# Patient Record
Sex: Male | Born: 1960 | Race: Asian | Hispanic: No | Marital: Married | Smoking: Former smoker
Health system: Southern US, Community
[De-identification: ages and names within clinical notes are randomized; demographics above are authoritative.]

---

## 2012-07-16 HISTORY — PX: COLONOSCOPY: SHX174

## 2015-03-03 ENCOUNTER — Other Ambulatory Visit: Payer: Self-pay | Admitting: Internal Medicine

## 2015-03-03 DIAGNOSIS — R634 Abnormal weight loss: Secondary | ICD-10-CM

## 2015-03-30 ENCOUNTER — Other Ambulatory Visit: Payer: Self-pay

## 2015-04-04 ENCOUNTER — Other Ambulatory Visit: Payer: Self-pay

## 2015-04-08 ENCOUNTER — Ambulatory Visit
Admission: RE | Admit: 2015-04-08 | Discharge: 2015-04-08 | Disposition: A | Discharge status: Home / Self Care | Payer: BC Managed Care – PPO | Source: Ambulatory Visit | Attending: Internal Medicine | Admitting: Internal Medicine

## 2015-04-08 DIAGNOSIS — R634 Abnormal weight loss: Secondary | ICD-10-CM

## 2015-04-08 MED ORDER — IOPAMIDOL (ISOVUE-300) INJECTION 61%
100.0000 mL | Freq: Once | INTRAVENOUS | Status: AC | PRN
  Administered 2015-04-08: 100 mL via INTRAVENOUS

## 2015-04-08 MED ORDER — IOHEXOL 300 MG/ML  SOLN
30.0000 mL | Freq: Once | INTRAMUSCULAR | Status: AC | PRN
  Administered 2015-04-08: 30 mL via ORAL

## 2015-05-12 ENCOUNTER — Inpatient Hospital Stay (HOSPITAL_COMMUNITY)
Admission: EM | Admit: 2015-05-12 | Discharge: 2015-05-19 | DRG: 339 | Disposition: A | Discharge status: Home / Self Care | Payer: BC Managed Care – PPO | Attending: Surgery | Admitting: Surgery

## 2015-05-12 ENCOUNTER — Encounter (HOSPITAL_COMMUNITY): Payer: Self-pay | Admitting: Emergency Medicine

## 2015-05-12 DIAGNOSIS — K353 Acute appendicitis with localized peritonitis: Principal | ICD-10-CM | POA: Diagnosis present

## 2015-05-12 DIAGNOSIS — K567 Ileus, unspecified: Secondary | ICD-10-CM | POA: Diagnosis not present

## 2015-05-12 DIAGNOSIS — R000 Tachycardia, unspecified: Secondary | ICD-10-CM | POA: Diagnosis not present

## 2015-05-12 DIAGNOSIS — N289 Disorder of kidney and ureter, unspecified: Secondary | ICD-10-CM | POA: Diagnosis present

## 2015-05-12 DIAGNOSIS — R1031 Right lower quadrant pain: Secondary | ICD-10-CM | POA: Diagnosis not present

## 2015-05-12 DIAGNOSIS — K3532 Acute appendicitis with perforation and localized peritonitis, without abscess: Secondary | ICD-10-CM

## 2015-05-12 DIAGNOSIS — E876 Hypokalemia: Secondary | ICD-10-CM | POA: Diagnosis not present

## 2015-05-12 LAB — COMPREHENSIVE METABOLIC PANEL
ALK PHOS: 64 U/L (ref 38–126)
ALT: 17 U/L (ref 17–63)
AST: 26 U/L (ref 15–41)
Albumin: 4.3 g/dL (ref 3.5–5.0)
Anion gap: 9 (ref 5–15)
BUN: 13 mg/dL (ref 6–20)
CHLORIDE: 98 mmol/L — AB (ref 101–111)
CO2: 27 mmol/L (ref 22–32)
CREATININE: 1.36 mg/dL — AB (ref 0.61–1.24)
Calcium: 9.4 mg/dL (ref 8.9–10.3)
GFR calc Af Amer: >60 mL/min (ref >60)
GFR, EST NON AFRICAN AMERICAN: 58 mL/min — AB (ref >60)
Glucose, Bld: 141 mg/dL — ABNORMAL HIGH (ref 65–99)
Potassium: 4.9 mmol/L (ref 3.5–5.1)
Sodium: 134 mmol/L — ABNORMAL LOW (ref 135–145)
Total Bilirubin: 2.7 mg/dL — ABNORMAL HIGH (ref 0.3–1.2)
Total Protein: 8 g/dL (ref 6.5–8.1)

## 2015-05-12 LAB — CBC
HCT: 49.3 % (ref 39.0–52.0)
Hemoglobin: 16.8 g/dL (ref 13.0–17.0)
MCH: 30.7 pg (ref 26.0–34.0)
MCHC: 34.1 g/dL (ref 30.0–36.0)
MCV: 90.1 fL (ref 78.0–100.0)
PLATELETS: 264 10*3/uL (ref 150–400)
RBC: 5.47 MIL/uL (ref 4.22–5.81)
RDW: 12.6 % (ref 11.5–15.5)
WBC: 15.3 10*3/uL — AB (ref 4.0–10.5)

## 2015-05-12 LAB — LIPASE, BLOOD: LIPASE: 24 U/L (ref 11–51)

## 2015-05-12 MED ORDER — SODIUM CHLORIDE 0.9 % IV SOLN: 1000.0000 mL | INTRAVENOUS | Status: DC

## 2015-05-12 MED ORDER — HYDROMORPHONE HCL 1 MG/ML IJ SOLN
1.0000 mg | Freq: Once | INTRAMUSCULAR | Status: AC
  Administered 2015-05-13: 1 mg via INTRAVENOUS
  Filled 2015-05-12: qty 1

## 2015-05-12 MED ORDER — ONDANSETRON HCL 4 MG/2ML IJ SOLN
4.0000 mg | Freq: Once | INTRAMUSCULAR | Status: AC
  Administered 2015-05-13: 4 mg via INTRAVENOUS
  Filled 2015-05-12: qty 2

## 2015-05-12 MED ORDER — SODIUM CHLORIDE 0.9 % IV SOLN
1000.0000 mL | Freq: Once | INTRAVENOUS | Status: AC
  Administered 2015-05-13: 1000 mL via INTRAVENOUS

## 2015-05-12 NOTE — ED Notes (Signed)
Pt in EMS from home reporting RLQ abd pain since yesterday. Denies N/V/D. Pt having normal BM. Pt speaks [place] and has a Statistician"korean surgeon" with him per EMS. This friend who is a Careers advisersurgeon told pt not to accept an IV with fluids or pain medicine and told the pt not to eat or drink anything, this "surgeon friend" has also self dx the pt with appendicitis and gastritis

## 2015-05-12 NOTE — ED Provider Notes (Signed)
CSN: [id number]     Arrival date & time 05/12/15  2219 History   By signing my name below, I, Alexander Watts, attest that this documentation has been prepared under the direction and in the presence of [name], MD.  Electronically Signed: Arlan Watts, ED Scribe. 05/12/2015. 11:49 PM.  Chief Complaint  Patient presents with  . Abdominal Pain   The history is provided by the patient. No language interpreter was used.    HPI Comments: Alexander Watts brought in by EMS is a 54 y.o. male without any pertinent past medical history who presents to the Emergency Department complaining of RLQ abdominal pain x 2 day. Pain made worse this evening after drinking some water. No other aggravating or alleviating factors reported since time of onset. Ongoing mild chills also reported. No OTC medications or home remedies attempted prior to arrival. Denies any recent fever, chills, nausea, vomiting, constipation, diarrhea, or dysuria. Denies any previous history of same.  PCP: No primary care provider on file.    History reviewed. No pertinent past medical history. History reviewed. No pertinent past surgical history. No family history on file. Social History  Substance Use Topics  . Smoking status: None  . Smokeless tobacco: None  . Alcohol Use: None    Review of Systems  Constitutional: Negative for fever and chills.  Respiratory: Negative for cough and shortness of breath.   Cardiovascular: Negative for chest pain.  Gastrointestinal: Positive for abdominal pain. Negative for nausea, vomiting, diarrhea and constipation.  Skin: Negative for rash.  Neurological: Negative for headaches.  Psychiatric/Behavioral: Negative for confusion.  All other systems reviewed and are negative.     Allergies  Review of patient's allergies indicates no known allergies.  Home Medications   Prior to Admission medications   Not on File   Triage Vitals: BP 125/75 mmHg  Pulse 111  Temp(Src) 99.5 F (37.5  C) (Oral)  Resp 18  Ht  (1.803 m)  Wt 180 lb (81.647 kg)  BMI 25.12 kg/m2  SpO2 97%  Physical Exam  Constitutional: He is oriented to person, place, and time. He appears well-developed and well-nourished.  HENT:  Head: Normocephalic and atraumatic.  Eyes: EOM are normal. Pupils are equal, round, and reactive to light.  Neck: Normal range of motion. Neck supple. No JVD present.  Cardiovascular: Normal rate, regular rhythm, normal heart sounds and intact distal pulses.   No murmur heard. Pulmonary/Chest: Effort normal and breath sounds normal. He has no wheezes. He has no rales. He exhibits no tenderness.  Abdominal: Soft. He exhibits no distension and no mass. There is tenderness. There is rebound. There is no guarding.  Diffuse tenderness noted. Max tenderness in RLQ with mild rebound tenderness  Musculoskeletal: Normal range of motion. He exhibits no edema.  Lymphadenopathy:    He has no cervical adenopathy.  Neurological: He is alert and oriented to person, place, and time. No cranial nerve deficit. He exhibits normal muscle tone. Coordination normal.  Skin: Skin is warm and dry. No rash noted.  Psychiatric: He has a normal mood and affect. His behavior is normal. Judgment and thought content normal.  Nursing note and vitals reviewed.   ED Course  Procedures (including critical care time)  DIAGNOSTIC STUDIES: Oxygen Saturation is 97% on RA, adequate by my interpretation.    COORDINATION OF CARE: 11:24 PM- Will order Lipase, CMP, CBC, and urinalysis.Discussed treatment plan with pt at bedside and pt agreed to plan.     Labs Review Results  for orders placed or performed during the hospital encounter of 05/12/15  Lipase, blood  Result Value Ref Range   Lipase 24 11 - 51 U/L  Comprehensive metabolic panel  Result Value Ref Range   Sodium 134 (L) 135 - 145 mmol/L   Potassium 4.9 3.5 - 5.1 mmol/L   Chloride 98 (L) 101 - 111 mmol/L   CO2 27 22 - 32 mmol/L   Glucose,  Bld 141 (H) 65 - 99 mg/dL   BUN 13 6 - 20 mg/dL   Creatinine, Ser 1.611.36 (H) 0.61 - 1.24 mg/dL   Calcium 9.4 8.9 - 09.610.3 mg/dL   Total Protein 8.0 6.5 - 8.1 g/dL   Albumin 4.3 3.5 - 5.0 g/dL   AST 26 15 - 41 U/L   ALT 17 17 - 63 U/L   Alkaline Phosphatase 64 38 - 126 U/L   Total Bilirubin 2.7 (H) 0.3 - 1.2 mg/dL   GFR calc non Af Amer 58 (L) >60 mL/min   GFR calc Af Amer >60 >60 mL/min   Anion gap 9 5 - 15  CBC  Result Value Ref Range   WBC 15.3 (H) 4.0 - 10.5 K/uL   RBC 5.47 4.22 - 5.81 MIL/uL   Hemoglobin 16.8 13.0 - 17.0 g/dL   HCT 04.549.3 40.939.0 - 81.152.0 %   MCV 90.1 78.0 - 100.0 fL   MCH 30.7 26.0 - 34.0 pg   MCHC 34.1 30.0 - 36.0 g/dL   RDW 91.412.6 78.211.5 - 95.615.5 %   Platelets 264 150 - 400 K/uL   Imaging Review Ct Abdomen Pelvis W Contrast  05/13/2015  CLINICAL DATA:  Severe RIGHT lower quadrant pain for 2 days, worse with drinking water. EXAM: CT ABDOMEN AND PELVIS WITH CONTRAST TECHNIQUE: Multidetector CT imaging of the abdomen and pelvis was performed using the standard protocol following bolus administration of intravenous contrast. CONTRAST:  100mL OMNIPAQUE IOHEXOL 300 MG/ML  SOLN COMPARISON:  CT abdomen and pelvis April 08, 2015 FINDINGS: LUNG BASES: Included view of the lung bases demonstrate patchy peripheral airspace opacities. Dependent atelectasis LEFT lung. SOLID ORGANS: The liver, spleen, gallbladder, pancreas and adrenal glands are unremarkable. GASTROINTESTINAL TRACT: The stomach, small and large bowel are normal in course and caliber without inflammatory changes. The appendix is enlarged, 13 mm transaxial dimension with multiple appendicoliths, largest measuring 6 mm. Appendix is hyperemic with extensive periappendiceal inflammation and small to moderate amount of free fluid in the RIGHT lower abdomen tracking to the mid pelvis Enteric contrast has not yet reached the distal small bowel. KIDNEYS/ URINARY TRACT: Kidneys are orthotopic, demonstrating symmetric enhancement. No  nephrolithiasis, hydronephrosis or solid renal masses. The unopacified ureters are normal in course and caliber. Delayed imaging through the kidneys demonstrates symmetric prompt contrast excretion within the proximal urinary collecting system. Urinary bladder is well distended and unremarkable. PERITONEUM/RETROPERITONEUM: Aortoiliac vessels are normal in course and caliber, trace calcific atherosclerosis. No lymphadenopathy by CT size criteria. Prostate size is normal. No intraperitoneal free fluid nor free air. SOFT TISSUE/OSSEOUS STRUCTURES: Non-suspicious. IMPRESSION: Acute appendicitis is likely perforated.  No abscess. Acute findings discussed with and reconfirmed by Dr.Tylan Briguglio on 05/13/2015 at 2:46 am. RIGHT lower lobe patchy atelectasis, less likely early pneumonia. Electronically Signed   By: Awilda Metroourtnay  Bloomer M.D.   On: 05/13/2015 02:47   I have personally reviewed and evaluated these images and lab results as part of my medical decision-making.   MDM   Final diagnoses:  Acute perforated appendicitis Renal insufficiency Elevated bilirubin  Abdominal pain suspicious for appendicitis. He shows signs of generalized peritoneal irritation which is concerning for possible perforation. He is sent for CT of abdomen and pelvis which confirms appendicitis with probable perforation. He is started on Zosyn for antibiotic coverage. Case is discussed with Dr. Andrey Campanile of surgery service who agrees to admit the patient. Also, please note mild renal insufficiency and elevated bilirubin. 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  I personally performed the services described in this documentation, which was scribed in my presence. The recorded information has been reviewed and is accurate.      [name], MD 05/13/15 [phone]

## 2015-05-12 NOTE — ED Notes (Signed)
EDP at bedside, translator is being used to speak with pt.

## 2015-05-13 ENCOUNTER — Encounter (HOSPITAL_COMMUNITY): Payer: Self-pay

## 2015-05-13 ENCOUNTER — Emergency Department (HOSPITAL_COMMUNITY): Payer: BC Managed Care – PPO | Admitting: Anesthesiology

## 2015-05-13 ENCOUNTER — Emergency Department (HOSPITAL_COMMUNITY): Payer: BC Managed Care – PPO

## 2015-05-13 ENCOUNTER — Encounter (HOSPITAL_COMMUNITY): Admission: EM | Disposition: A | Discharge status: Home / Self Care | Payer: Self-pay | Source: Home / Self Care

## 2015-05-13 DIAGNOSIS — E876 Hypokalemia: Secondary | ICD-10-CM | POA: Diagnosis not present

## 2015-05-13 DIAGNOSIS — R000 Tachycardia, unspecified: Secondary | ICD-10-CM | POA: Diagnosis not present

## 2015-05-13 DIAGNOSIS — K567 Ileus, unspecified: Secondary | ICD-10-CM | POA: Diagnosis not present

## 2015-05-13 DIAGNOSIS — R17 Unspecified jaundice: Secondary | ICD-10-CM | POA: Diagnosis present

## 2015-05-13 DIAGNOSIS — K3532 Acute appendicitis with perforation and localized peritonitis, without abscess: Secondary | ICD-10-CM | POA: Diagnosis present

## 2015-05-13 DIAGNOSIS — N289 Disorder of kidney and ureter, unspecified: Secondary | ICD-10-CM | POA: Diagnosis present

## 2015-05-13 DIAGNOSIS — K353 Acute appendicitis with localized peritonitis: Secondary | ICD-10-CM | POA: Diagnosis present

## 2015-05-13 DIAGNOSIS — R1031 Right lower quadrant pain: Secondary | ICD-10-CM | POA: Diagnosis present

## 2015-05-13 HISTORY — PX: APPENDECTOMY: SHX54

## 2015-05-13 HISTORY — PX: LAPAROSCOPIC APPENDECTOMY: SHX408

## 2015-05-13 SURGERY — APPENDECTOMY, LAPAROSCOPIC
Anesthesia: General | Site: Abdomen

## 2015-05-13 MED ORDER — 0.9 % SODIUM CHLORIDE (POUR BTL) OPTIME
TOPICAL | Status: DC | PRN
  Administered 2015-05-13: 1000 mL

## 2015-05-13 MED ORDER — LIDOCAINE HCL (CARDIAC) 20 MG/ML IV SOLN
INTRAVENOUS | Status: AC
  Filled 2015-05-13: qty 5

## 2015-05-13 MED ORDER — ROCURONIUM BROMIDE 50 MG/5ML IV SOLN
INTRAVENOUS | Status: AC
  Filled 2015-05-13: qty 2

## 2015-05-13 MED ORDER — PIPERACILLIN-TAZOBACTAM 3.375 G IVPB 30 MIN
3.3750 g | Freq: Once | INTRAVENOUS | Status: AC
  Administered 2015-05-13: 3.375 g via INTRAVENOUS
  Filled 2015-05-13: qty 50

## 2015-05-13 MED ORDER — ZOLPIDEM TARTRATE 5 MG PO TABS: 5.0000 mg | ORAL_TABLET | Freq: Every evening | ORAL | Status: DC | PRN

## 2015-05-13 MED ORDER — BUPIVACAINE-EPINEPHRINE (PF) 0.5% -1:200000 IJ SOLN
INTRAMUSCULAR | Status: AC
  Filled 2015-05-13: qty 30

## 2015-05-13 MED ORDER — SIMETHICONE 80 MG PO CHEW
40.0000 mg | CHEWABLE_TABLET | Freq: Four times a day (QID) | ORAL | Status: DC | PRN
  Administered 2015-05-14: 40 mg via ORAL
  Filled 2015-05-13: qty 1

## 2015-05-13 MED ORDER — ONDANSETRON HCL 4 MG/2ML IJ SOLN
INTRAMUSCULAR | Status: AC
  Filled 2015-05-13: qty 4

## 2015-05-13 MED ORDER — LIDOCAINE HCL (CARDIAC) 20 MG/ML IV SOLN
INTRAVENOUS | Status: DC | PRN
  Administered 2015-05-13: 50 mg via INTRAVENOUS

## 2015-05-13 MED ORDER — BUPIVACAINE-EPINEPHRINE (PF) 0.25% -1:200000 IJ SOLN
INTRAMUSCULAR | Status: AC
  Filled 2015-05-13: qty 30

## 2015-05-13 MED ORDER — EPHEDRINE SULFATE 50 MG/ML IJ SOLN
INTRAMUSCULAR | Status: AC
  Filled 2015-05-13: qty 1

## 2015-05-13 MED ORDER — FENTANYL CITRATE (PF) 100 MCG/2ML IJ SOLN
INTRAMUSCULAR | Status: DC | PRN
Start: 2015-05-13 — End: 2015-05-13
  Administered 2015-05-13: 150 ug via INTRAVENOUS

## 2015-05-13 MED ORDER — ONDANSETRON HCL 4 MG/2ML IJ SOLN
4.0000 mg | Freq: Four times a day (QID) | INTRAMUSCULAR | Status: DC | PRN
  Administered 2015-05-13: 4 mg via INTRAVENOUS
  Filled 2015-05-13: qty 2

## 2015-05-13 MED ORDER — ENOXAPARIN SODIUM 40 MG/0.4ML ~~LOC~~ SOLN
40.0000 mg | SUBCUTANEOUS | Status: DC
  Administered 2015-05-14 – 2015-05-19 (×6): 40 mg via SUBCUTANEOUS
  Filled 2015-05-13 (×6): qty 0.4

## 2015-05-13 MED ORDER — DIPHENHYDRAMINE HCL 25 MG PO CAPS: 25.0000 mg | ORAL_CAPSULE | Freq: Four times a day (QID) | ORAL | Status: DC | PRN

## 2015-05-13 MED ORDER — ONDANSETRON 4 MG PO TBDP: 4.0000 mg | ORAL_TABLET | Freq: Four times a day (QID) | ORAL | Status: DC | PRN

## 2015-05-13 MED ORDER — FENTANYL CITRATE (PF) 100 MCG/2ML IJ SOLN
INTRAMUSCULAR | Status: AC
  Filled 2015-05-13: qty 2

## 2015-05-13 MED ORDER — HYDROMORPHONE HCL 1 MG/ML IJ SOLN
1.0000 mg | INTRAMUSCULAR | Status: DC | PRN
  Administered 2015-05-13: 1 mg via INTRAVENOUS
  Filled 2015-05-13: qty 1

## 2015-05-13 MED ORDER — LACTATED RINGERS IV SOLN
INTRAVENOUS | Status: DC | PRN
  Administered 2015-05-13 (×2): via INTRAVENOUS

## 2015-05-13 MED ORDER — ONDANSETRON HCL 4 MG/2ML IJ SOLN
INTRAMUSCULAR | Status: DC | PRN
  Administered 2015-05-13: 4 mg via INTRAVENOUS

## 2015-05-13 MED ORDER — SODIUM CHLORIDE 0.9 % IR SOLN
Status: DC | PRN
  Administered 2015-05-13: 1000 mL

## 2015-05-13 MED ORDER — PIPERACILLIN-TAZOBACTAM 3.375 G IVPB
3.3750 g | Freq: Three times a day (TID) | INTRAVENOUS | Status: DC
  Administered 2015-05-13 – 2015-05-19 (×18): 3.375 g via INTRAVENOUS
  Filled 2015-05-13 (×21): qty 50

## 2015-05-13 MED ORDER — OXYCODONE-ACETAMINOPHEN 5-325 MG PO TABS
1.0000 | ORAL_TABLET | ORAL | Status: DC | PRN
  Administered 2015-05-13 – 2015-05-14 (×3): 2 via ORAL
  Administered 2015-05-15: 1 via ORAL
  Filled 2015-05-13 (×3): qty 2
  Filled 2015-05-13: qty 1

## 2015-05-13 MED ORDER — DIPHENHYDRAMINE HCL 50 MG/ML IJ SOLN: 25.0000 mg | Freq: Four times a day (QID) | INTRAMUSCULAR | Status: DC | PRN

## 2015-05-13 MED ORDER — SODIUM CHLORIDE 0.9 % IV SOLN
1000.0000 mL | INTRAVENOUS | Status: DC
  Administered 2015-05-13: 1000 mL via INTRAVENOUS

## 2015-05-13 MED ORDER — FENTANYL CITRATE (PF) 100 MCG/2ML IJ SOLN
25.0000 ug | INTRAMUSCULAR | Status: DC | PRN
  Administered 2015-05-13: 50 ug via INTRAVENOUS

## 2015-05-13 MED ORDER — SUCCINYLCHOLINE CHLORIDE 20 MG/ML IJ SOLN
INTRAMUSCULAR | Status: AC
  Filled 2015-05-13: qty 2

## 2015-05-13 MED ORDER — ONDANSETRON HCL 4 MG/2ML IJ SOLN
4.0000 mg | Freq: Four times a day (QID) | INTRAMUSCULAR | Status: DC | PRN
  Administered 2015-05-16 – 2015-05-18 (×3): 4 mg via INTRAVENOUS
  Filled 2015-05-13 (×3): qty 2

## 2015-05-13 MED ORDER — MIDAZOLAM HCL 2 MG/2ML IJ SOLN
INTRAMUSCULAR | Status: AC
  Filled 2015-05-13: qty 4

## 2015-05-13 MED ORDER — FENTANYL CITRATE (PF) 250 MCG/5ML IJ SOLN
INTRAMUSCULAR | Status: AC
  Filled 2015-05-13: qty 5

## 2015-05-13 MED ORDER — PHENYLEPHRINE HCL 10 MG/ML IJ SOLN
INTRAMUSCULAR | Status: DC | PRN
  Administered 2015-05-13: 80 ug via INTRAVENOUS

## 2015-05-13 MED ORDER — SODIUM CHLORIDE 0.9 % IJ SOLN
INTRAMUSCULAR | Status: AC
  Filled 2015-05-13: qty 10

## 2015-05-13 MED ORDER — GLYCOPYRROLATE 0.2 MG/ML IJ SOLN
INTRAMUSCULAR | Status: AC
  Filled 2015-05-13: qty 4

## 2015-05-13 MED ORDER — INFLUENZA VAC SPLIT QUAD 0.5 ML IM SUSY
0.5000 mL | PREFILLED_SYRINGE | INTRAMUSCULAR | Status: AC
  Administered 2015-05-15: 0.5 mL via INTRAMUSCULAR
  Filled 2015-05-13: qty 0.5

## 2015-05-13 MED ORDER — SUCCINYLCHOLINE CHLORIDE 20 MG/ML IJ SOLN
INTRAMUSCULAR | Status: DC | PRN
  Administered 2015-05-13: 100 mg via INTRAVENOUS

## 2015-05-13 MED ORDER — ACETAMINOPHEN 325 MG PO TABS: 650.0000 mg | ORAL_TABLET | Freq: Four times a day (QID) | ORAL | Status: DC | PRN

## 2015-05-13 MED ORDER — PROPOFOL 10 MG/ML IV BOLUS
INTRAVENOUS | Status: DC | PRN
  Administered 2015-05-13: 200 mg via INTRAVENOUS

## 2015-05-13 MED ORDER — BUPIVACAINE-EPINEPHRINE 0.25% -1:200000 IJ SOLN
INTRAMUSCULAR | Status: DC | PRN
  Administered 2015-05-13: 5 mL

## 2015-05-13 MED ORDER — MIDAZOLAM HCL 5 MG/5ML IJ SOLN
INTRAMUSCULAR | Status: DC | PRN
  Administered 2015-05-13: 2 mg via INTRAVENOUS

## 2015-05-13 MED ORDER — IOHEXOL 300 MG/ML  SOLN
100.0000 mL | Freq: Once | INTRAMUSCULAR | Status: AC | PRN
  Administered 2015-05-13: 100 mL via INTRAVENOUS

## 2015-05-13 MED ORDER — GLYCOPYRROLATE 0.2 MG/ML IJ SOLN
INTRAMUSCULAR | Status: DC | PRN
  Administered 2015-05-13: 0.6 mg via INTRAVENOUS

## 2015-05-13 MED ORDER — ROCURONIUM BROMIDE 100 MG/10ML IV SOLN
INTRAVENOUS | Status: DC | PRN
  Administered 2015-05-13: 35 mg via INTRAVENOUS

## 2015-05-13 MED ORDER — POTASSIUM CHLORIDE IN NACL 20-0.9 MEQ/L-% IV SOLN
INTRAVENOUS | Status: DC
  Administered 2015-05-13 – 2015-05-14 (×4): via INTRAVENOUS
  Administered 2015-05-15 – 2015-05-16 (×2): 1 mL via INTRAVENOUS
  Filled 2015-05-13 (×7): qty 1000

## 2015-05-13 MED ORDER — ACETAMINOPHEN 650 MG RE SUPP: 650.0000 mg | Freq: Four times a day (QID) | RECTAL | Status: DC | PRN

## 2015-05-13 MED ORDER — ONDANSETRON HCL 4 MG/2ML IJ SOLN: 4.0000 mg | Freq: Once | INTRAMUSCULAR | Status: DC | PRN

## 2015-05-13 MED ORDER — PROPOFOL 10 MG/ML IV BOLUS
INTRAVENOUS | Status: AC
  Filled 2015-05-13: qty 20

## 2015-05-13 MED ORDER — NEOSTIGMINE METHYLSULFATE 10 MG/10ML IV SOLN
INTRAVENOUS | Status: DC | PRN
  Administered 2015-05-13: 4 mg via INTRAVENOUS

## 2015-05-13 SURGICAL SUPPLY — 48 items
APPLIER CLIP ROT 10 11.4 M/L (STAPLE)
BENZOIN TINCTURE PRP APPL 2/3 (GAUZE/BANDAGES/DRESSINGS) ×3 IMPLANT
BLADE SURG ROTATE 9660 (MISCELLANEOUS) IMPLANT
CANISTER SUCTION 2500CC (MISCELLANEOUS) ×3 IMPLANT
CHLORAPREP W/TINT 26ML (MISCELLANEOUS) ×3 IMPLANT
CLIP APPLIE ROT 10 11.4 M/L (STAPLE) IMPLANT
CLOSURE WOUND 1/2 X4 (GAUZE/BANDAGES/DRESSINGS) ×1
COVER SURGICAL LIGHT HANDLE (MISCELLANEOUS) ×3 IMPLANT
CUTTER FLEX LINEAR 45M (STAPLE) ×3 IMPLANT
DRAIN CHANNEL 19F RND (DRAIN) ×3 IMPLANT
DRSG TEGADERM 2-3/8X2-3/4 SM (GAUZE/BANDAGES/DRESSINGS) ×6 IMPLANT
DRSG TEGADERM 4X4.75 (GAUZE/BANDAGES/DRESSINGS) ×3 IMPLANT
ELECT REM PT RETURN 9FT ADLT (ELECTROSURGICAL) ×3
ELECTRODE REM PT RTRN 9FT ADLT (ELECTROSURGICAL) ×1 IMPLANT
ENDOLOOP SUT PDS II  0 18 (SUTURE)
ENDOLOOP SUT PDS II 0 18 (SUTURE) IMPLANT
EVACUATOR SILICONE 100CC (DRAIN) ×3 IMPLANT
FILTER SMOKE EVAC LAPAROSHD (FILTER) ×3 IMPLANT
GAUZE SPONGE 2X2 8PLY STRL LF (GAUZE/BANDAGES/DRESSINGS) ×1 IMPLANT
GLOVE BIO SURGEON STRL SZ 6.5 (GLOVE) ×2 IMPLANT
GLOVE BIO SURGEON STRL SZ7 (GLOVE) ×3 IMPLANT
GLOVE BIO SURGEONS STRL SZ 6.5 (GLOVE) ×1
GLOVE BIOGEL PI IND STRL 6.5 (GLOVE) ×2 IMPLANT
GLOVE BIOGEL PI IND STRL 7.5 (GLOVE) ×2 IMPLANT
GLOVE BIOGEL PI INDICATOR 6.5 (GLOVE) ×4
GLOVE BIOGEL PI INDICATOR 7.5 (GLOVE) ×4
GLOVE ECLIPSE 7.5 STRL STRAW (GLOVE) ×3 IMPLANT
GOWN STRL REUS W/ TWL LRG LVL3 (GOWN DISPOSABLE) ×3 IMPLANT
GOWN STRL REUS W/TWL LRG LVL3 (GOWN DISPOSABLE) ×6
KIT BASIN OR (CUSTOM PROCEDURE TRAY) ×3 IMPLANT
KIT ROOM TURNOVER OR (KITS) ×3 IMPLANT
NS IRRIG 1000ML POUR BTL (IV SOLUTION) ×3 IMPLANT
PAD ARMBOARD 7.5X6 YLW CONV (MISCELLANEOUS) ×6 IMPLANT
POUCH SPECIMEN RETRIEVAL 10MM (ENDOMECHANICALS) ×3 IMPLANT
RELOAD STAPLE TA45 3.5 REG BLU (ENDOMECHANICALS) ×6 IMPLANT
SCALPEL HARMONIC ACE (MISCELLANEOUS) ×3 IMPLANT
SET IRRIG TUBING LAPAROSCOPIC (IRRIGATION / IRRIGATOR) ×3 IMPLANT
SPECIMEN JAR SMALL (MISCELLANEOUS) ×3 IMPLANT
SPONGE GAUZE 2X2 STER 10/PKG (GAUZE/BANDAGES/DRESSINGS) ×2
STRIP CLOSURE SKIN 1/2X4 (GAUZE/BANDAGES/DRESSINGS) ×2 IMPLANT
SUT ETHILON 2 0 FS 18 (SUTURE) ×3 IMPLANT
SUT MNCRL AB 4-0 PS2 18 (SUTURE) ×3 IMPLANT
TOWEL OR 17X24 6PK STRL BLUE (TOWEL DISPOSABLE) ×3 IMPLANT
TOWEL OR 17X26 10 PK STRL BLUE (TOWEL DISPOSABLE) ×3 IMPLANT
TRAY LAPAROSCOPIC MC (CUSTOM PROCEDURE TRAY) ×3 IMPLANT
TROCAR XCEL BLADELESS 5X75MML (TROCAR) ×6 IMPLANT
TROCAR XCEL BLUNT TIP 100MML (ENDOMECHANICALS) ×3 IMPLANT
TUBING INSUFFLATION (TUBING) ×3 IMPLANT

## 2015-05-13 NOTE — ED Notes (Signed)
Pt undressed, clothes in belongings bag at bedside

## 2015-05-13 NOTE — Anesthesia Preprocedure Evaluation (Addendum)
Anesthesia Evaluation  Patient identified by MRN, date of birth, ID band Patient awake    Reviewed: Allergy & Precautions, NPO status , Patient's Chart, lab work & pertinent test results  History of Anesthesia Complications Negative for: history of anesthetic complications  Airway Mallampati: II  TM Distance: >3 FB Neck ROM: Full    Dental no notable dental hx. (+) Dental Advisory Given   Pulmonary neg pulmonary ROS,    Pulmonary exam normal breath sounds clear to auscultation       Cardiovascular negative cardio ROS Normal cardiovascular exam Rhythm:Regular Rate:Normal     Neuro/Psych negative neurological ROS  negative psych ROS   GI/Hepatic negative GI ROS, Neg liver ROS,   Endo/Other  negative endocrine ROS  Renal/GU negative Renal ROS  negative genitourinary   Musculoskeletal negative musculoskeletal ROS (+)   Abdominal   Peds negative pediatric ROS (+)  Hematology negative hematology ROS (+)   Anesthesia Other Findings   Reproductive/Obstetrics negative OB ROS                             Anesthesia Physical Anesthesia Plan  ASA: II and emergent  Anesthesia Plan: General   Post-op Pain Management:    Induction: Intravenous, Rapid sequence and Cricoid pressure planned  Airway Management Planned: Oral ETT  Additional Equipment:   Intra-op Plan:   Post-operative Plan: Extubation in OR  Informed Consent: I have reviewed the patients History and Physical, chart, labs and discussed the procedure including the risks, benefits and alternatives for the proposed anesthesia with the patient or authorized representative who has indicated his/her understanding and acceptance.   Dental advisory given and Dental Advisory Given  Plan Discussed with: CRNA, Anesthesiologist and Surgeon  Anesthesia Plan Comments:       Anesthesia Quick Evaluation

## 2015-05-13 NOTE — Op Note (Signed)
Appendectomy, Lap, Procedure Note  Indications: The patient presented with a history of right-sided abdominal pain for about 3 days. A CT scan revealed findings consistent with acute appendicitis with possible perforation, but no sign of abscess.  He is brought emergently to the operating room for laparoscopic appendectomy.  Pre-operative Diagnosis: Acute appendicitis with generalized peritonitis  Post-operative Diagnosis: Perforated appendicitis with peritonitis  Surgeon: Manus RuddSUEI,Jeptha Hinnenkamp K.   Assistants: none  Anesthesia: General endotracheal anesthesia  ASA Class: 1E  Procedure Details  The patient was seen again in the Holding Room. The risks, benefits, complications, treatment options, and expected outcomes were discussed with the patient and/or family. The possibilities of reaction to medication, perforation of viscus, bleeding, recurrent infection, finding a normal appendix, the need for additional procedures, failure to diagnose a condition, and creating a complication requiring transfusion or operation were discussed. There was concurrence with the proposed plan and informed consent was obtained. The site of surgery was properly noted. The patient was taken to Operating Room, [facility] and the procedure verified as Appendectomy. A Time Out was held and the above information confirmed.  The patient was placed in the supine position and general anesthesia was induced.  A Foley catheter was placed under sterile technique. The abdomen was prepped and draped in a sterile fashion. A one centimeter supraumbilical incision was made.  Dissection was carried down to the fascia bluntly.  The fascia was incised vertically.  We entered the peritoneal cavity bluntly.  A pursestring suture was passed around the incision with a 0 Vicryl.  The Hasson cannula was introduced into the abdomen and the tails of the suture were used to hold the Hasson in place.   The pneumoperitoneum was then  established maintaining a maximum pressure of 15 mmHg.  Additional 5 mm cannulas then placed in the left lower quadrant of the abdomen and the right upper quadrant under direct visualization. A careful evaluation of the entire abdomen was carried out. The patient was placed in Trendelenburg and left lateral decubitus position.  The scope was moved to the right upper quadrant port site. The cecum was mobilized medially.  The appendix was identified and was quite edematous and thickened.  There is obvious perforation in at least two spots in the mid and distal appendix.  There is some fibrinous exudate and some cloudy fluid around the appendix.  The appendix was carefully dissected. The appendix was was skeletonized with the harmonic scalpel.   The appendix was divided at its base using an endo-GIA stapler. Minimal appendiceal stump was left in place. There was no evidence of bleeding, leakage, or complication after division of the appendix. Irrigation was also performed and irrigate suctioned from the abdomen as well.  A 19 Fr Blake drain was placed through the RUQ port site and placed into the pelvis.  The drain was secured with 3-0 Ethilon.  The umbilical port site was closed with the purse string suture. There was no residual palpable fascial defect.  The trocar site skin wounds were closed with 4-0 Monocryl.  Instrument, sponge, and needle counts were correct at the conclusion of the case.   Findings: The appendix was found to be inflamed. There were signs of necrosis.  There was perforation. There was not abscess formation.  Estimated Blood Loss:  less than 50 mL         Drains: 19 Fr Blake drain         Specimens: appendix  Complications:  None; patient tolerated the procedure well.         Disposition: PACU - hemodynamically stable.         Condition: stable  Wilmon Arms. Corliss Skains, MD, [facility]  General/ Trauma Surgery  05/13/2015 8:46 AM

## 2015-05-13 NOTE — Transfer of Care (Signed)
Immediate Anesthesia Transfer of Care Note  Patient: Alexander Watts  Procedure(s) Performed: Procedure(s): APPENDECTOMY LAPAROSCOPIC  Patient Location: PACU  Anesthesia Type:General  Level of Consciousness: awake, alert , patient cooperative and responds to stimulation  Airway & Oxygen Therapy: Patient Spontanous Breathing  Post-op Assessment: Report given to RN and Post -op Vital signs reviewed and stable  Post vital signs: Reviewed and stable  Last Vitals:  Filed Vitals:   05/13/15 0858  BP:   Pulse:   Temp: 37.2 C  Resp:     Complications: No apparent anesthesia complications

## 2015-05-13 NOTE — ED Notes (Signed)
O2 noted to be 88% RA. Placed pt on 2L O2 sats up to 96%.

## 2015-05-13 NOTE — ED Notes (Signed)
Pt back from CT

## 2015-05-13 NOTE — Anesthesia Postprocedure Evaluation (Signed)
  Anesthesia Post-op Note  Patient: Alexander Watts  Procedure(s) Performed: Procedure(s): APPENDECTOMY LAPAROSCOPIC  Patient Location: PACU  Anesthesia Type: General  Level of Consciousness: awake and alert   Airway and Oxygen Therapy: Patient Spontanous Breathing  Post-op Pain: mild  Post-op Assessment: Post-op Vital signs reviewed, Patient's Cardiovascular Status Stable, Respiratory Function Stable, Patent Airway and No signs of Nausea or vomiting  Last Vitals:  Filed Vitals:   05/13/15 0955  BP:   Pulse: 105  Temp:   Resp: 12    Post-op Vital Signs: stable   Complications: No apparent anesthesia complications

## 2015-05-13 NOTE — H&P (Signed)
Alexander Watts is an 54 y.o. male.   Chief Complaint: abd pain History obtained using language line interpreter.   HPI: 54 yo [place] male brought to ED by EMS after having abd pain for 2 days which is worsening. abd pain was initially generalized but now in RLQ. Constant. Worsening. Some mild chills. No prior symptoms. Decreased appetite today. No fever. No d/c. Denies other pmhx. Denies Rx meds. Denies tob. Denies cp, sob, doe, wt loss  History reviewed. No pertinent past medical history.  History reviewed. No pertinent past surgical history.  No family history on file. Social History:  has no tobacco, alcohol, and drug history on file.  Allergies: No Known Allergies   (Not in a hospital admission)  Results for orders placed or performed during the hospital encounter of 05/12/15 (from the past 48 hour(s))  Lipase, blood     Status: None   Collection Time: 05/12/15 10:35 PM  Result Value Ref Range   Lipase 24 11 - 51 U/L    Comment: Please note change in reference range.  Comprehensive metabolic panel     Status: Abnormal   Collection Time: 05/12/15 10:35 PM  Result Value Ref Range   Sodium 134 (L) 135 - 145 mmol/L   Potassium 4.9 3.5 - 5.1 mmol/L    Comment: SPECIMEN HEMOLYZED. HEMOLYSIS MAY AFFECT INTEGRITY OF RESULTS.   Chloride 98 (L) 101 - 111 mmol/L   CO2 27 22 - 32 mmol/L   Glucose, Bld 141 (H) 65 - 99 mg/dL   BUN 13 6 - 20 mg/dL   Creatinine, Ser 1.36 (H) 0.61 - 1.24 mg/dL   Calcium 9.4 8.9 - 10.3 mg/dL   Total Protein 8.0 6.5 - 8.1 g/dL   Albumin 4.3 3.5 - 5.0 g/dL   AST 26 15 - 41 U/L   ALT 17 17 - 63 U/L   Alkaline Phosphatase 64 38 - 126 U/L   Total Bilirubin 2.7 (H) 0.3 - 1.2 mg/dL   GFR calc non Af Amer 58 (L) >60 mL/min   GFR calc Af Amer >60 >60 mL/min    Comment: (NOTE) The eGFR has been calculated using the CKD EPI equation. This calculation has not been validated in all clinical situations. eGFR's persistently <60 mL/min signify possible Chronic  Kidney Disease.    Anion gap 9 5 - 15  CBC     Status: Abnormal   Collection Time: 05/12/15 10:35 PM  Result Value Ref Range   WBC 15.3 (H) 4.0 - 10.5 K/uL   RBC 5.47 4.22 - 5.81 MIL/uL   Hemoglobin 16.8 13.0 - 17.0 g/dL   HCT 49.3 39.0 - 52.0 %   MCV 90.1 78.0 - 100.0 fL   MCH 30.7 26.0 - 34.0 pg   MCHC 34.1 30.0 - 36.0 g/dL   RDW 12.6 11.5 - 15.5 %   Platelets 264 150 - 400 K/uL   Ct Abdomen Pelvis W Contrast  05/13/2015  CLINICAL DATA:  Severe RIGHT lower quadrant pain for 2 days, worse with drinking water. EXAM: CT ABDOMEN AND PELVIS WITH CONTRAST TECHNIQUE: Multidetector CT imaging of the abdomen and pelvis was performed using the standard protocol following bolus administration of intravenous contrast. CONTRAST:  144m OMNIPAQUE IOHEXOL 300 MG/ML  SOLN COMPARISON:  CT abdomen and pelvis April 08, 2015 FINDINGS: LUNG BASES: Included view of the lung bases demonstrate patchy peripheral airspace opacities. Dependent atelectasis LEFT lung. SOLID ORGANS: The liver, spleen, gallbladder, pancreas and adrenal glands are unremarkable. GASTROINTESTINAL TRACT: The stomach,  small and large bowel are normal in course and caliber without inflammatory changes. The appendix is enlarged, 13 mm transaxial dimension with multiple appendicoliths, largest measuring 6 mm. Appendix is hyperemic with extensive periappendiceal inflammation and small to moderate amount of free fluid in the RIGHT lower abdomen tracking to the mid pelvis Enteric contrast has not yet reached the distal small bowel. KIDNEYS/ URINARY TRACT: Kidneys are orthotopic, demonstrating symmetric enhancement. No nephrolithiasis, hydronephrosis or solid renal masses. The unopacified ureters are normal in course and caliber. Delayed imaging through the kidneys demonstrates symmetric prompt contrast excretion within the proximal urinary collecting system. Urinary bladder is well distended and unremarkable. PERITONEUM/RETROPERITONEUM: Aortoiliac  vessels are normal in course and caliber, trace calcific atherosclerosis. No lymphadenopathy by CT size criteria. Prostate size is normal. No intraperitoneal free fluid nor free air. SOFT TISSUE/OSSEOUS STRUCTURES: Non-suspicious. IMPRESSION: Acute appendicitis is likely perforated.  No abscess. Acute findings discussed with and reconfirmed by Dr.DAVID GLICK on 16/04/9603 at 2:46 am. RIGHT lower lobe patchy atelectasis, less likely early pneumonia. Electronically Signed   By: Elon Alas M.D.   On: 05/13/2015 02:47    Review of Systems  Constitutional: Positive for chills. Negative for fever and weight loss.  HENT: Negative for nosebleeds.   Eyes: Negative for blurred vision.  Respiratory: Negative for shortness of breath.   Cardiovascular: Negative for chest pain, palpitations, orthopnea and PND.       Denies DOE  Gastrointestinal: Positive for nausea and abdominal pain.  Genitourinary: Negative for dysuria and hematuria.  Musculoskeletal: Negative.   Skin: Negative for itching and rash.  Neurological: Negative for dizziness, focal weakness, seizures, loss of consciousness and headaches.       Denies TIAs, amaurosis fugax  Endo/Heme/Allergies: Does not bruise/bleed easily.  Psychiatric/Behavioral: The patient is not nervous/anxious.     Blood pressure 105/64, pulse 103, temperature 99.5 F (37.5 C), temperature source Oral, resp. rate 16, height 5' 11"  (1.803 m), weight 81.647 kg (180 lb), SpO2 94 %. Physical Exam  Vitals reviewed. Constitutional: He is oriented to person, place, and time. He appears well-developed and well-nourished.  Looks ill; clammy.   HENT:  Head: Normocephalic and atraumatic.  Right Ear: External ear normal.  Left Ear: External ear normal.  Eyes: Conjunctivae are normal. No scleral icterus.  Neck: Normal range of motion. Neck supple. No tracheal deviation present. No thyromegaly present.  Cardiovascular: Normal rate and normal heart sounds.    Respiratory: Effort normal and breath sounds normal. No stridor. No respiratory distress. He has no wheezes.  GI: Soft. There is tenderness. There is guarding (voluntary) and tenderness at McBurney's point. There is no rebound.    Significant RLQ TTP; +guarding. Probable localized peritonitis. No generalized peritonitis.   Musculoskeletal: He exhibits no edema or tenderness.  Lymphadenopathy:    He has no cervical adenopathy.  Neurological: He is alert and oriented to person, place, and time. He exhibits normal muscle tone.  Skin: Skin is warm and dry. No rash noted. He is not diaphoretic. No erythema. No pallor.  Psychiatric: He has a normal mood and affect. His behavior is normal. Judgment and thought content normal.     Assessment/Plan Acute appendicitis, probable ruptured Elevated Creatinine  We discussed the etiology and management of acute appendicitis. We discussed operative and nonoperative management.  I recommended operative management along with IV antibiotics.  We discussed laparoscopic appendectomy. We discussed the risk and benefits of surgery including but not limited to bleeding, infection, injury to surrounding structures, need to  convert to an open procedure, blood clot formation, post operative abscess or wound infection, staple line complications such as leak or bleeding, hernia formation, post operative ileus, need for additional procedures, anesthesia complications, and the typical postoperative course. I explained that the patient should expect a good improvement in their symptoms.  We discussed that he is higher risk for conversion to open, potential bowel resection, ileus.  Wife at bs for entire conversation.  Used interpreter for whole pt interview and counseling.  No addl questions.  Repeat labs in am  Leighton Ruff. Redmond Pulling, MD, FACS General, Bariatric, & Minimally Invasive Surgery [facility], [place]    [facility] M 05/13/2015, 5:23 AM

## 2015-05-13 NOTE — Anesthesia Procedure Notes (Signed)
Procedure Name: Intubation Date/Time: 05/13/2015 7:49 AM Performed by: Carmela RimaMARTINELLI, Maleeya Peterkin F Pre-anesthesia Checklist: Patient being monitored, Suction available, Emergency Drugs available, Patient identified and Timeout performed Patient Re-evaluated:Patient Re-evaluated prior to inductionOxygen Delivery Method: Circle system utilized Preoxygenation: Pre-oxygenation with 100% oxygen Intubation Type: IV induction Ventilation: Mask ventilation without difficulty Laryngoscope Size: Mac, 3 and Glidescope (jaw rigidity and anterior anatomy) Grade View: Grade II Tube type: Oral Tube size: 7.5 mm Number of attempts: 2 Placement Confirmation: positive ETCO2,  ETT inserted through vocal cords under direct vision and breath sounds checked- equal and bilateral Secured at: 22 cm Tube secured with: Tape Dental Injury: Teeth and Oropharynx as per pre-operative assessment

## 2015-05-13 NOTE — ED Notes (Signed)
Patient is resting comfortably. 

## 2015-05-13 NOTE — ED Notes (Signed)
Patient transported to CT 

## 2015-05-13 NOTE — Care Management Note (Signed)
Case Management Note  Patient Details  Name: Mellody MemosSeungwon Delprado MRN: [id number] Date of Birth: 01/29/1961  Subjective/Objective:                    Action/Plan:  Initial review completed  Expected Discharge Date:                  Expected Discharge Plan:  Home/Self Care  In-House Referral:     Discharge planning Services     Post Acute Care Choice:    Choice offered to:     DME Arranged:    DME Agency:     HH Arranged:    HH Agency:     Status of Service:  In process, will continue to follow  Medicare Important Message Given:    Date Medicare IM Given:    Medicare IM give by:    Date Additional Medicare IM Given:    Additional Medicare Important Message give by:     If discussed at Long Length of Stay Meetings, dates discussed:    Additional Comments:  Kingsley PlanWile, Conswella Bruney Marie, RN 05/13/2015, 3:22 PM

## 2015-05-14 LAB — BASIC METABOLIC PANEL
ANION GAP: 9 (ref 5–15)
BUN: 10 mg/dL (ref 6–20)
CALCIUM: 8.4 mg/dL — AB (ref 8.9–10.3)
CHLORIDE: 100 mmol/L — AB (ref 101–111)
CO2: 25 mmol/L (ref 22–32)
Creatinine, Ser: 1.34 mg/dL — ABNORMAL HIGH (ref 0.61–1.24)
GFR calc non Af Amer: 59 mL/min — ABNORMAL LOW (ref >60)
GLUCOSE: 111 mg/dL — AB (ref 65–99)
Potassium: 3.2 mmol/L — ABNORMAL LOW (ref 3.5–5.1)
SODIUM: 134 mmol/L — AB (ref 135–145)

## 2015-05-14 LAB — CBC
HCT: 43.4 % (ref 39.0–52.0)
HEMOGLOBIN: 14.2 g/dL (ref 13.0–17.0)
MCH: 30.2 pg (ref 26.0–34.0)
MCHC: 32.7 g/dL (ref 30.0–36.0)
MCV: 92.3 fL (ref 78.0–100.0)
Platelets: 217 10*3/uL (ref 150–400)
RBC: 4.7 MIL/uL (ref 4.22–5.81)
RDW: 13.2 % (ref 11.5–15.5)
WBC: 16.4 10*3/uL — ABNORMAL HIGH (ref 4.0–10.5)

## 2015-05-14 NOTE — Progress Notes (Signed)
1 Day Post-Op  Subjective: Stable and alert.  Afebrile.  Heart rate 100.  Respiratory rate 16.  SPO2 95%.  Excellent urine output.   Moving slowly with moderate pain.  He has been able to void.  He has been able to ambulate some.  Denies nausea.  Has been tolerating liquids. Morning lab work pending.  Objective: Vital signs in last 24 hours: Temp:  [97.9 F (36.6 C)-99.5 F (37.5 C)] 98.4 F (36.9 C) (10/29 0436) Pulse Rate:  [100-114] 101 (10/29 0436) Resp:  [12-19] 16 (10/29 0436) BP: (104-129)/(62-82) 129/82 mmHg (10/29 0436) SpO2:  [93 %-99 %] 95 % (10/29 0436)    Intake/Output from previous day: 10/28 0701 - 10/29 0700 In: 2803 [P.O.:480; I.V.:2223; IV Piggyback:100] Out: 2345 [Urine:2175; Drains:145; Blood:25] Intake/Output this shift: Total I/O In: 1583 [P.O.:360; I.V.:1123; IV Piggyback:100] Out: 1800 [Urine:1775; Drains:25]    EXAM: General appearance: Slightly sedated from pain medication, but appropriate.  Moves slowly.  Lots of pain.  Skin warm and dry. Resp: clear to auscultation bilaterally GI: Soft.  Appropriately tender.  Minimal bowel sounds.  Not distended.  JP drainage cloudy and watery.  Lab Results:  No results found for this or any previous visit (from the past 24 hour(s)).   Studies/Results: No results found.  . enoxaparin (LOVENOX) injection  40 mg Subcutaneous Q24H  . Influenza vac split quadrivalent PF  0.5 mL Intramuscular Tomorrow-1000  . piperacillin-tazobactam (ZOSYN)  IV  3.375 g Intravenous Q8H     Assessment/Plan: s/p Procedure(s): APPENDECTOMY LAPAROSCOPIC  POD #1.  Laparoscopic appendectomy for perforated appendicitis with localized peritonitis Stable, but not ready for discharge Advance diet and activities as tolerated Incentive spirometry encouraged. Continue IV antibiotics Drain to be left in for now.  @PROBHOSP @  LOS: 1 day    Jhoana Upham M 05/14/2015  . .prob

## 2015-05-15 LAB — BASIC METABOLIC PANEL
ANION GAP: 7 (ref 5–15)
BUN: 10 mg/dL (ref 6–20)
CALCIUM: 7.9 mg/dL — AB (ref 8.9–10.3)
CO2: 26 mmol/L (ref 22–32)
Chloride: 100 mmol/L — ABNORMAL LOW (ref 101–111)
Creatinine, Ser: 1.08 mg/dL (ref 0.61–1.24)
GLUCOSE: 108 mg/dL — AB (ref 65–99)
POTASSIUM: 3.4 mmol/L — AB (ref 3.5–5.1)
Sodium: 133 mmol/L — ABNORMAL LOW (ref 135–145)

## 2015-05-15 LAB — CBC
HEMATOCRIT: 38.1 % — AB (ref 39.0–52.0)
Hemoglobin: 12.3 g/dL — ABNORMAL LOW (ref 13.0–17.0)
MCH: 30 pg (ref 26.0–34.0)
MCHC: 32.3 g/dL (ref 30.0–36.0)
MCV: 92.9 fL (ref 78.0–100.0)
PLATELETS: 230 10*3/uL (ref 150–400)
RBC: 4.1 MIL/uL — AB (ref 4.22–5.81)
RDW: 13.1 % (ref 11.5–15.5)
WBC: 12.4 10*3/uL — AB (ref 4.0–10.5)

## 2015-05-15 MED ORDER — POTASSIUM CHLORIDE 10 MEQ/100ML IV SOLN
10.0000 meq | INTRAVENOUS | Status: DC
  Administered 2015-05-15: 10 meq via INTRAVENOUS
  Filled 2015-05-15: qty 100

## 2015-05-15 MED ORDER — POTASSIUM CHLORIDE CRYS ER 20 MEQ PO TBCR
40.0000 meq | EXTENDED_RELEASE_TABLET | Freq: Once | ORAL | Status: AC
  Administered 2015-05-15: 40 meq via ORAL
  Filled 2015-05-15: qty 2

## 2015-05-15 MED ORDER — BISACODYL 10 MG RE SUPP
10.0000 mg | Freq: Two times a day (BID) | RECTAL | Status: AC
  Administered 2015-05-15 (×2): 10 mg via RECTAL
  Filled 2015-05-15 (×2): qty 1

## 2015-05-15 MED ORDER — HYDROCODONE-ACETAMINOPHEN 10-325 MG PO TABS
1.0000 | ORAL_TABLET | ORAL | Status: DC | PRN
  Administered 2015-05-16 – 2015-05-18 (×4): 1 via ORAL
  Filled 2015-05-15 (×4): qty 1

## 2015-05-15 MED ORDER — HYDROMORPHONE HCL 1 MG/ML IJ SOLN: 0.5000 mg | INTRAMUSCULAR | Status: DC | PRN

## 2015-05-15 MED ORDER — POLYETHYLENE GLYCOL 3350 17 G PO PACK
17.0000 g | PACK | Freq: Once | ORAL | Status: AC
  Administered 2015-05-15: 17 g via ORAL
  Filled 2015-05-15: qty 1

## 2015-05-15 NOTE — Progress Notes (Signed)
2 Days Post-Op  Subjective: Drink lots of fluid just today.  Not much solid food.  Denies nausea No stool or flatus Feels distended No increase in pain Good urine output  Afebrile.  Vital signs stable. Potassium 3.4.  Creatinine 1.08.  WBC 12,400.  Coming down.  Hemoglobin 12.3.  Objective: Vital signs in last 24 hours: Temp:  [98.2 F (36.8 C)-98.7 F (37.1 C)] 98.4 F (36.9 C) (10/30 0509) Pulse Rate:  [101-107] 101 (10/30 0509) Resp:  [16-17] 16 (10/30 0509) BP: (123-132)/(76-84) 124/79 mmHg (10/30 0509) SpO2:  [93 %-97 %] 93 % (10/30 0509) Last BM Date:  (pre op)  Intake/Output from previous day: 10/29 0701 - 10/30 0700 In: 4300 [P.O.:1720; I.V.:2380; IV Piggyback:200] Out: 3592 [Urine:3400; Drains:192] Intake/Output this shift: Total I/O In: 360 [P.O.:360] Out: 350 [Urine:350]  General appearance: Alert.  Mild distress.  Asked like he doesn't feel that good but is not toxic or acutely ill. Resp: clear to auscultation bilaterally GI: Soft, but distended and tympanitic.  Hypoactive bowel sounds.  JP drainage is now clear and serous.  Lab Results:  Results for orders placed or performed during the hospital encounter of 05/12/15 (from the past 24 hour(s))  CBC     Status: Abnormal   Collection Time: 05/15/15  5:15 AM  Result Value Ref Range   WBC 12.4 (H) 4.0 - 10.5 K/uL   RBC 4.10 (L) 4.22 - 5.81 MIL/uL   Hemoglobin 12.3 (L) 13.0 - 17.0 g/dL   HCT 16.138.1 (L) 09.639.0 - 04.552.0 %   MCV 92.9 78.0 - 100.0 fL   MCH 30.0 26.0 - 34.0 pg   MCHC 32.3 30.0 - 36.0 g/dL   RDW 40.913.1 81.111.5 - 91.415.5 %   Platelets 230 150 - 400 K/uL  Basic metabolic panel     Status: Abnormal   Collection Time: 05/15/15  5:15 AM  Result Value Ref Range   Sodium 133 (L) 135 - 145 mmol/L   Potassium 3.4 (L) 3.5 - 5.1 mmol/L   Chloride 100 (L) 101 - 111 mmol/L   CO2 26 22 - 32 mmol/L   Glucose, Bld 108 (H) 65 - 99 mg/dL   BUN 10 6 - 20 mg/dL   Creatinine, Ser 7.821.08 0.61 - 1.24 mg/dL   Calcium 7.9 (L)  8.9 - 10.3 mg/dL   GFR calc non Af Amer >60 >60 mL/min   GFR calc Af Amer >60 >60 mL/min   Anion gap 7 5 - 15     Studies/Results: No results found.  . bisacodyl  10 mg Rectal BID  . enoxaparin (LOVENOX) injection  40 mg Subcutaneous Q24H  . Influenza vac split quadrivalent PF  0.5 mL Intramuscular Tomorrow-1000  . piperacillin-tazobactam (ZOSYN)  IV  3.375 g Intravenous Q8H  . polyethylene glycol  17 g Oral Once  . potassium chloride  10 mEq Intravenous Q1 Hr x 4     Assessment/Plan: s/p Procedure(s): APPENDECTOMY LAPAROSCOPIC  POD #2.  Laparoscopic appendectomy for perforated appendicitis with localized peritonitis Still has ileus and not ready for discharge yet Increase ambulation Dulcolax suppository MiraLAX Decreased narcotic Continue antibiotics Leave drain for now, but possibly remove upon discharge  Hypokalemia.  Mild.  May be contributing to ileus Replace aggressively Labs tomorrow  @PROBHOSP @  LOS: 2 days    Alexander Watts 05/15/2015  . .prob

## 2015-05-16 ENCOUNTER — Encounter (HOSPITAL_COMMUNITY): Payer: Self-pay | Admitting: Surgery

## 2015-05-16 LAB — BASIC METABOLIC PANEL
ANION GAP: 9 (ref 5–15)
BUN: 5 mg/dL — AB (ref 6–20)
CHLORIDE: 104 mmol/L (ref 101–111)
CO2: 25 mmol/L (ref 22–32)
Calcium: 8.6 mg/dL — ABNORMAL LOW (ref 8.9–10.3)
Creatinine, Ser: 1.01 mg/dL (ref 0.61–1.24)
Glucose, Bld: 122 mg/dL — ABNORMAL HIGH (ref 65–99)
POTASSIUM: 3.6 mmol/L (ref 3.5–5.1)
SODIUM: 138 mmol/L (ref 135–145)

## 2015-05-16 LAB — CBC
HEMATOCRIT: 38.8 % — AB (ref 39.0–52.0)
Hemoglobin: 12.8 g/dL — ABNORMAL LOW (ref 13.0–17.0)
MCH: 30.2 pg (ref 26.0–34.0)
MCHC: 33 g/dL (ref 30.0–36.0)
MCV: 91.5 fL (ref 78.0–100.0)
Platelets: 263 10*3/uL (ref 150–400)
RBC: 4.24 MIL/uL (ref 4.22–5.81)
RDW: 12.9 % (ref 11.5–15.5)
WBC: 13.2 10*3/uL — AB (ref 4.0–10.5)

## 2015-05-16 MED ORDER — AMOXICILLIN-POT CLAVULANATE 875-125 MG PO TABS: 1.0000 | ORAL_TABLET | Freq: Two times a day (BID) | ORAL | Status: AC

## 2015-05-16 MED ORDER — HYDROCODONE-ACETAMINOPHEN 10-325 MG PO TABS: 1.0000 | ORAL_TABLET | Freq: Four times a day (QID) | ORAL | Status: AC | PRN

## 2015-05-16 NOTE — Care Management Note (Signed)
Case Management Note  Patient Details  Name: Mellody MemosSeungwon Weakland MRN: [id number] Date of Birth: 01/19/1961  Subjective/Objective:                    Action/Plan:  UR updated  Expected Discharge Date:                  Expected Discharge Plan:  Home/Self Care  In-House Referral:     Discharge planning Services     Post Acute Care Choice:    Choice offered to:     DME Arranged:    DME Agency:     HH Arranged:    HH Agency:     Status of Service:  In process, will continue to follow  Medicare Important Message Given:    Date Medicare IM Given:    Medicare IM give by:    Date Additional Medicare IM Given:    Additional Medicare Important Message give by:     If discussed at Long Length of Stay Meetings, dates discussed:    Additional Comments:  Kingsley PlanWile, Camreigh Michie Marie, RN 05/16/2015, 1:51 PM

## 2015-05-16 NOTE — Discharge Summary (Signed)
  [facility] Discharge Summary   Patient ID: Alexander MemosSeungwon Watts MRN: [id number] DOB/AGE: 53/03/1961 53 y.o.  Admit date: 05/12/2015 Discharge date: 05/19/2015  Admitting Diagnosis: Acute perforated appendicitis with peritonitis  Discharge Diagnosis Patient Active Problem List   Diagnosis Date Noted  . Acute perforated appendicitis 05/13/2015    Consultants None  Imaging: No results found.  Procedures Dr. Corliss Skainssuei (05/13/15) - Laparoscopic Appendectomy  Hospital Course:  54 yo [place] male brought to ED by EMS after having abd pain for 2 days which is worsening. abd pain was initially generalized but now in RLQ. Constant. Worsening. Some mild chills. No prior symptoms. Decreased appetite today. No fever. No d/c. Denies other pmhx. Denies Rx meds. Denies tob. Denies cp, sob, doe, wt loss.  Workup showed acute perforated appendicitis with generalized peritonitis.  Patient was admitted and underwent procedure listed above.  Tolerated procedure well and was transferred to the floor.  Diet was advanced as tolerated.  Mild leukocytosis persists, but clinically much improved.  He had some tachycardia and thus he was kept a couple of more days.  His WBC peaked at 15.0, and on 05/18/15 was 14.5.  He is much less distended, having BM's, pain much improved, afebrile.  WBC on day of discharge is only 11.0.  On POD #6, the patient was voiding well, tolerating diet, ambulating well, pain well controlled, vital signs stable, incisions c/d/i and felt stable for discharge home.  His JP drain was removed prior to discharge as it was only serous drainage.  Patient will follow up in our office in 2 weeks and knows to call with questions or concerns.  He will be discharged with augmentin to finish out his antibiotic course.      Medication List    TAKE these medications        amoxicillin-clavulanate 875-125 MG tablet  Commonly known as:  AUGMENTIN  Take 1 tablet by mouth 2 (two) times daily.     HYDROcodone-acetaminophen 10-325 MG tablet  Commonly known as:  NORCO  Take 1 tablet by mouth every 6 (six) hours as needed for moderate pain.     ondansetron 4 MG disintegrating tablet  Commonly known as:  ZOFRAN-ODT  Take 1 tablet (4 mg total) by mouth every 8 (eight) hours as needed for nausea or vomiting.         Follow-up Information    [facility] On [facility].   Specialty:  General Surgery   Why:  For post-operation check. Your appointment is at 11:45am, please arrive at least 30min before your appointment to complete your check in paperwork.  If you are unable to arrive 30min prior to your appointment time we may have to cancel or reschedule you   Contact information:   [address] [place] [id number] [phone]      Signed: Bradd CanaryMegan N. Paralee Pendergrass, PA-C [facility] [phone]  05/19/2015, 8:05 AM

## 2015-05-16 NOTE — Discharge Instructions (Signed)
Your appointment is at 11:45am, please arrive at least 30min before your appointment to complete your check in paperwork.  If you are unable to arrive 30min prior to your appointment time we may have to cancel or reschedule you. ° °LAPAROSCOPIC SURGERY: POST OP INSTRUCTIONS  °1. DIET: Follow a light bland diet the first 24 hours after arrival home, such as soup, liquids, crackers, etc. Be sure to include lots of fluids daily. Avoid fast food or heavy meals as your are more likely to get nauseated. Eat a low fat the next few days after surgery.  °2. Take your usually prescribed home medications unless otherwise directed. °3. PAIN CONTROL:  °1. Pain is best controlled by a usual combination of three different methods TOGETHER:  °1. Ice/Heat °2. Over the counter pain medication °3. Prescription pain medication °2. Most patients will experience some swelling and bruising around the incisions. Ice packs or heating pads (30-60 minutes up to 6 times a day) will help. Use ice for the first few days to help decrease swelling and bruising, then switch to heat to help relax tight/sore spots and speed recovery. Some people prefer to use ice alone, heat alone, alternating between ice & heat. Experiment to what works for you. Swelling and bruising can take several weeks to resolve.  °3. It is helpful to take an over-the-counter pain medication regularly for the first few weeks. Choose one of the following that works best for you:  °1. Naproxen (Aleve, etc) Two 220mg tabs twice a day °2. Ibuprofen (Advil, etc) Three 200mg tabs four times a day (every meal & bedtime) °3. Acetaminophen (Tylenol, etc) 500-650mg four times a day (every meal & bedtime) °4. A prescription for pain medication (such as oxycodone, hydrocodone, etc) should be given to you upon discharge. Take your pain medication as prescribed.  °1. If you are having problems/concerns with the prescription medicine (does not control pain, nausea, vomiting, rash, itching,  etc), please call us [phone] to see if we need to switch you to a different pain medicine that will work better for you and/or control your side effect better. °2. If you need a refill on your pain medication, please contact your pharmacy. They will contact our office to request authorization. Prescriptions will not be filled after 5 pm or on week-ends. °4. Avoid getting constipated. Between the surgery and the pain medications, it is common to experience some constipation. Increasing fluid intake and taking a fiber supplement (such as Metamucil, Citrucel, FiberCon, MiraLax, etc) 1-2 times a day regularly will usually help prevent this problem from occurring. A mild laxative (prune juice, Milk of Magnesia, MiraLax, etc) should be taken according to package directions if there are no bowel movements after 48 hours.  °5. Watch out for diarrhea. If you have many loose bowel movements, simplify your diet to bland foods & liquids for a few days. Stop any stool softeners and decrease your fiber supplement. Switching to mild anti-diarrheal medications (Kayopectate, Pepto Bismol) can help. If this worsens or does not improve, please call us. °6. Wash / shower every day. You may shower over the dressings as they are waterproof. Continue to shower over incision(s) after the dressing is off. °7. Remove your waterproof bandages 5 days after surgery. You may leave the incision open to air. You may replace a dressing/Band-Aid to cover the incision for comfort if you wish.  °8. ACTIVITIES as tolerated:  °1. You may resume regular (light) daily activities beginning the next day--such as daily self-care,   walking, climbing stairs--gradually increasing activities as tolerated. If you can walk 30 minutes without difficulty, it is safe to try more intense activity such as jogging, treadmill, bicycling, low-impact aerobics, swimming, etc. °2. Save the most intensive and strenuous activity for last such as sit-ups, heavy lifting,  contact sports, etc Refrain from any heavy lifting or straining until you are off narcotics for pain control.  °3. DO NOT PUSH THROUGH PAIN. Let pain be your guide: If it hurts to do something, don't do it. Pain is your body warning you to avoid that activity for another week until the pain goes down. °4. You may drive when you are no longer taking prescription pain medication, you can comfortably wear a seatbelt, and you can safely maneuver your car and apply brakes. °5. You may have sexual intercourse when it is comfortable.  °9. FOLLOW UP in our office  °1. Please call [facility] at [phone] to set up an appointment to see your surgeon in the office for a follow-up appointment approximately 2-3 weeks after your surgery. °2. Make sure that you call for this appointment the day you arrive home to insure a convenient appointment time. °     10. IF YOU HAVE DISABILITY OR FAMILY LEAVE FORMS, BRING THEM TO THE               OFFICE FOR PROCESSING.  ° °WHEN TO CALL US [phone]:  °1. Poor pain control °2. Reactions / problems with new medications (rash/itching, nausea, etc)  °3. Fever over 101.5 F (38.5 C) °4. Inability to urinate °5. Nausea and/or vomiting °6. Worsening swelling or bruising °7. Continued bleeding from incision. °8. Increased pain, redness, or drainage from the incision ° °The clinic staff is available to answer your questions during regular business hours (8:30am-5pm). Please don’t hesitate to call and ask to speak to one of our nurses for clinical concerns.  °If you have a medical emergency, go to the nearest emergency room or call 911.  °A surgeon from [facility] is always on call at the hospitals  ° °[facility]  °[address], [address], [place], [place] [address] ?  °MAIN: [phone] ? TOLL FREE: [phone] ?  °FAX [phone]  °[email or web address] ° ° °

## 2015-05-16 NOTE — Progress Notes (Signed)
[facility] Progress Note  3 Days Post-Op  Subjective: Pt doing okay, had 4BM's since getting dulcolax twice.  No N/V.  Ambulating some.  Pain better controlled.  Drain still draining serous fluid.  Wife at bedside.  He wants to go home.  Tolerating about 50% of his soft diet tray.  Objective: Vital signs in last 24 hours: Temp:  [98.5 F (36.9 C)-98.7 F (37.1 C)] 98.7 F (37.1 C) (10/31 0600) Pulse Rate:  [97-112] 98 (10/31 0600) Resp:  [16-17] 17 (10/31 0600) BP: (133-153)/(89-94) 153/89 mmHg (10/31 0600) SpO2:  [93 %-98 %] 93 % (10/31 0600) Last BM Date: 05/16/15  Intake/Output from previous day: 10/30 0701 - 10/31 0700 In: 3134.2 [P.O.:1440; I.V.:1544.2; IV Piggyback:150] Out: 1680 [Urine:1450; Drains:230] Intake/Output this shift:    PE: Gen:  Alert, NAD, pleasant Card:  RRR, no M/G/R heard Pulm:  CTA, no W/R/R Abd: Soft, NT/ND, +BS, no HSM, incisions C/D/I, drain with minimal serous drainage 24630mL/24hr   Lab Results:   Recent Labs  05/15/15 0515 05/16/15 0534  WBC 12.4* 13.2*  HGB 12.3* 12.8*  HCT 38.1* 38.8*  PLT 230 263   BMET  Recent Labs  05/15/15 0515 05/16/15 0534  NA 133* 138  K 3.4* 3.6  CL 100* 104  CO2 26 25  GLUCOSE 108* 122*  BUN 10 5*  CREATININE 1.08 1.01  CALCIUM 7.9* 8.6*   PT/INR No results for input(s): LABPROT, INR in the last 72 hours. CMP     Component Value Date/Time   NA 138 05/16/2015 0534   K 3.6 05/16/2015 0534   CL 104 05/16/2015 0534   CO2 25 05/16/2015 0534   GLUCOSE 122* 05/16/2015 0534   BUN 5* 05/16/2015 0534   CREATININE 1.01 05/16/2015 0534   CALCIUM 8.6* 05/16/2015 0534   PROT 8.0 05/12/2015 2235   ALBUMIN 4.3 05/12/2015 2235   AST 26 05/12/2015 2235   ALT 17 05/12/2015 2235   ALKPHOS 64 05/12/2015 2235   BILITOT 2.7* 05/12/2015 2235   GFRNONAA >60 05/16/2015 0534   GFRAA >60 05/16/2015 0534   Lipase     Component Value Date/Time   LIPASE 24 05/12/2015 2235        Studies/Results: No results found.  Anti-infectives: Anti-infectives    Start     Dose/Rate Route Frequency Ordered Stop   05/13/15 1230  piperacillin-tazobactam (ZOSYN) IVPB 3.375 g     3.375 g 12.5 mL/hr over 240 Minutes Intravenous Every 8 hours 05/13/15 1141     05/13/15 0300  piperacillin-tazobactam (ZOSYN) IVPB 3.375 g     3.375 g 100 mL/hr over 30 Minutes Intravenous  Once 05/13/15 0247 05/13/15 0357       Assessment/Plan POD #3. Laparoscopic appendectomy for perforated appendicitis with localized peritonitis -Soft diet ileus resolving, only mildly distended -Increase ambulation -Dulcolax suppository -MiraLAX -Decreased narcotic -Continue antibiotics -Plan to d/c drain at discharge as it's only serous 2130mL/24hr  Hypokalemia.  -Improved. May be contributing to ileus -Labs tomorrow  Leukocytosis - 13.2     LOS: 3 days    Nonie HoyerMegan N Dmitri Pettigrew 05/16/2015, 9:06 AM Pager: [phone]

## 2015-05-17 LAB — BASIC METABOLIC PANEL
Anion gap: 11 (ref 5–15)
BUN: 6 mg/dL (ref 6–20)
CALCIUM: 8.6 mg/dL — AB (ref 8.9–10.3)
CO2: 28 mmol/L (ref 22–32)
CREATININE: 1.13 mg/dL (ref 0.61–1.24)
Chloride: 97 mmol/L — ABNORMAL LOW (ref 101–111)
GFR calc Af Amer: >60 mL/min (ref >60)
GLUCOSE: 118 mg/dL — AB (ref 65–99)
Potassium: 3.5 mmol/L (ref 3.5–5.1)
Sodium: 136 mmol/L (ref 135–145)

## 2015-05-17 LAB — CBC
HCT: 40.4 % (ref 39.0–52.0)
Hemoglobin: 13.5 g/dL (ref 13.0–17.0)
MCH: 30.6 pg (ref 26.0–34.0)
MCHC: 33.4 g/dL (ref 30.0–36.0)
MCV: 91.6 fL (ref 78.0–100.0)
PLATELETS: 308 10*3/uL (ref 150–400)
RBC: 4.41 MIL/uL (ref 4.22–5.81)
RDW: 12.7 % (ref 11.5–15.5)
WBC: 15 10*3/uL — AB (ref 4.0–10.5)

## 2015-05-17 NOTE — Progress Notes (Signed)
[facility] Progress Note  4 Days Post-Op  Subjective: He says he feels better than yesterday.  Pt c/o nausea, abdominal distension, pain and fatigue.  He walked yesterday 11 laps.  He's tolerating some soft diet, but anorexic.  Having good flatus, but no BM since yesterday am.  Wife at bedside. WBC went up to 15,000, they are concerned he's not improving.  Objective: Vital signs in last 24 hours: Temp:  [97.7 F (36.5 C)-99.4 F (37.4 C)] 97.7 F (36.5 C) (11/01 0657) Pulse Rate:  [91-102] 91 (11/01 0657) Resp:  [16-18] 18 (11/01 0657) BP: (142-156)/(85-99) 142/85 mmHg (11/01 0657) SpO2:  [95 %-99 %] 95 % (11/01 0657) Last BM Date: 05/16/15  Intake/Output from previous day: 10/31 0701 - 11/01 0700 In: 1358 [P.O.:760; I.V.:346; IV Piggyback:250] Out: 2015 [Urine:1850; Drains:165] Intake/Output this shift: Total I/O In: 355.7 [P.O.:280; I.V.:25.7; IV Piggyback:50] Out: 200 [Urine:200]  PE: Gen:  Alert, NAD, pleasant Abd: Soft, quite distended, tender, few BS, no HSM, incisions C/D/I, drain with serous drainage 15125mL/24hr   Lab Results:   Recent Labs  05/16/15 0534 05/17/15 0505  WBC 13.2* 15.0*  HGB 12.8* 13.5  HCT 38.8* 40.4  PLT 263 308   BMET  Recent Labs  05/16/15 0534 05/17/15 0505  NA 138 136  K 3.6 3.5  CL 104 97*  CO2 25 28  GLUCOSE 122* 118*  BUN 5* 6  CREATININE 1.01 1.13  CALCIUM 8.6* 8.6*   PT/INR No results for input(s): LABPROT, INR in the last 72 hours. CMP     Component Value Date/Time   NA 136 05/17/2015 0505   K 3.5 05/17/2015 0505   CL 97* 05/17/2015 0505   CO2 28 05/17/2015 0505   GLUCOSE 118* 05/17/2015 0505   BUN 6 05/17/2015 0505   CREATININE 1.13 05/17/2015 0505   CALCIUM 8.6* 05/17/2015 0505   PROT 8.0 05/12/2015 2235   ALBUMIN 4.3 05/12/2015 2235   AST 26 05/12/2015 2235   ALT 17 05/12/2015 2235   ALKPHOS 64 05/12/2015 2235   BILITOT 2.7* 05/12/2015 2235   GFRNONAA >60 05/17/2015 0505   GFRAA >60  05/17/2015 0505   Lipase     Component Value Date/Time   LIPASE 24 05/12/2015 2235       Studies/Results: No results found.  Anti-infectives: Anti-infectives    Start     Dose/Rate Route Frequency Ordered Stop   05/16/15 0000  amoxicillin-clavulanate (AUGMENTIN) 875-125 MG tablet     1 tablet Oral 2 times daily 05/16/15 0934     05/13/15 1230  piperacillin-tazobactam (ZOSYN) IVPB 3.375 g     3.375 g 12.5 mL/hr over 240 Minutes Intravenous Every 8 hours 05/13/15 1141     05/13/15 0300  piperacillin-tazobactam (ZOSYN) IVPB 3.375 g     3.375 g 100 mL/hr over 30 Minutes Intravenous  Once 05/13/15 0247 05/13/15 0357       Assessment/Plan POD #4. Laparoscopic appendectomy for perforated appendicitis with localized peritonitis -Tolerating some soft diet, but anorexic, some nausea and distension.  I'm worried he has an ileus or more worrisome given perforation a developing intra-abdominal abscess.  His WBC went up to 15,000 today.  No fevers, but was tachy yesterday.   -Increase ambulation -Dulcolax suppository -MiraLAX -Decreased narcotic -Continue antibiotics -Continue drain for now, 19425mL/24hr.  Potentially plan to d/c drain at discharge if continues to be only serous  Hypokalemia.  -Improved.  Leukocytosis - 15.0 -Concerning given perforated appendix, would consider repeat CT on POD #7 if  WBC continuing to go up   Disp - Continue inpatient    LOS: 4 days    Nonie Hoyer 05/17/2015, 10:12 AM Pager: [phone]

## 2015-05-18 LAB — CBC
HEMATOCRIT: 42.8 % (ref 39.0–52.0)
HEMOGLOBIN: 14.3 g/dL (ref 13.0–17.0)
MCH: 30.4 pg (ref 26.0–34.0)
MCHC: 33.4 g/dL (ref 30.0–36.0)
MCV: 90.9 fL (ref 78.0–100.0)
Platelets: 390 10*3/uL (ref 150–400)
RBC: 4.71 MIL/uL (ref 4.22–5.81)
RDW: 12.7 % (ref 11.5–15.5)
WBC: 14.5 10*3/uL — AB (ref 4.0–10.5)

## 2015-05-18 MED ORDER — ONDANSETRON 4 MG PO TBDP: 4.0000 mg | ORAL_TABLET | Freq: Three times a day (TID) | ORAL | Status: AC | PRN

## 2015-05-18 NOTE — Progress Notes (Signed)
[facility] Progress Note  5 Days Post-Op  Subjective: Pt feels better today.  Still with some nausea and some pain/distension.  Up ambulating well.  Tolerating diet, but anorexic had 25% of breakfast.  Urinating well, having flatus and had a good BM this am.     Objective: Vital signs in last 24 hours: Temp:  [97.4 F (36.3 C)-97.8 F (36.6 C)] 97.5 F (36.4 C) (11/02 0504) Pulse Rate:  [85-93] 85 (11/02 0504) Resp:  [16-18] 16 (11/02 0504) BP: (127-157)/(86-100) 142/93 mmHg (11/02 0504) SpO2:  [96 %-98 %] 96 % (11/02 0504) Last BM Date: 05/18/15  Intake/Output from previous day: 11/01 0701 - 11/02 0700 In: 1268.5 [P.O.:1078; I.V.:90.5; IV Piggyback:100] Out: 2410 [Urine:2250; Drains:160] Intake/Output this shift: Total I/O In: -  Out: 250 [Urine:200; Drains:50]  PE: Gen:  Alert, NAD, pleasant Abd: Soft, less distension and pain today, +BS, no HSM, incisions C/D/I, drain with serous drainage   Lab Results:   Recent Labs  05/17/15 0505 05/18/15 0730  WBC 15.0* 14.5*  HGB 13.5 14.3  HCT 40.4 42.8  PLT 308 390   BMET  Recent Labs  05/16/15 0534 05/17/15 0505  NA 138 136  K 3.6 3.5  CL 104 97*  CO2 25 28  GLUCOSE 122* 118*  BUN 5* 6  CREATININE 1.01 1.13  CALCIUM 8.6* 8.6*   PT/INR No results for input(s): LABPROT, INR in the last 72 hours. CMP     Component Value Date/Time   NA 136 05/17/2015 0505   K 3.5 05/17/2015 0505   CL 97* 05/17/2015 0505   CO2 28 05/17/2015 0505   GLUCOSE 118* 05/17/2015 0505   BUN 6 05/17/2015 0505   CREATININE 1.13 05/17/2015 0505   CALCIUM 8.6* 05/17/2015 0505   PROT 8.0 05/12/2015 2235   ALBUMIN 4.3 05/12/2015 2235   AST 26 05/12/2015 2235   ALT 17 05/12/2015 2235   ALKPHOS 64 05/12/2015 2235   BILITOT 2.7* 05/12/2015 2235   GFRNONAA >60 05/17/2015 0505   GFRAA >60 05/17/2015 0505   Lipase     Component Value Date/Time   LIPASE 24 05/12/2015 2235       Studies/Results: No results  found.  Anti-infectives: Anti-infectives    Start     Dose/Rate Route Frequency Ordered Stop   05/16/15 0000  amoxicillin-clavulanate (AUGMENTIN) 875-125 MG tablet     1 tablet Oral 2 times daily 05/16/15 0934     05/13/15 1230  piperacillin-tazobactam (ZOSYN) IVPB 3.375 g     3.375 g 12.5 mL/hr over 240 Minutes Intravenous Every 8 hours 05/13/15 1141     05/13/15 0300  piperacillin-tazobactam (ZOSYN) IVPB 3.375 g     3.375 g 100 mL/hr over 30 Minutes Intravenous  Once 05/13/15 0247 05/13/15 0357       Assessment/Plan POD #5. Laparoscopic appendectomy for perforated appendicitis with localized peritonitis -Tolerating some soft diet, still anorexic, less nausea and distension today. Still could have an abscess vs just ileus.  No fevers, tachycardia resolved -Increase ambulation -Bowel regimen, dulcolax suppository - had a BM this am -MiraLAX -Decreased narcotic -Continue antibiotics -Continue drain for now, 28400mL/24hr. Potentially plan to d/c drain at discharge if continues to be only serous  Leukocytosis - 14.5 -Minimal improvement  Disp - Will see if he's ready to d/c home this afternoon    LOS: 5 days    Nonie HoyerMegan N Printice Hellmer 05/18/2015, 9:16 AM Pager: [phone]

## 2015-05-19 LAB — CBC
HCT: 39.9 % (ref 39.0–52.0)
Hemoglobin: 13.3 g/dL (ref 13.0–17.0)
MCH: 30.2 pg (ref 26.0–34.0)
MCHC: 33.3 g/dL (ref 30.0–36.0)
MCV: 90.7 fL (ref 78.0–100.0)
Platelets: 386 10*3/uL (ref 150–400)
RBC: 4.4 MIL/uL (ref 4.22–5.81)
RDW: 12.8 % (ref 11.5–15.5)
WBC: 11 10*3/uL — ABNORMAL HIGH (ref 4.0–10.5)

## 2015-05-19 NOTE — Progress Notes (Signed)
Discharge home. Home discharge instruction given, no question verbalized. 

## 2017-08-29 IMAGING — CT CT ABD-PELV W/ CM
3 of 5 series · 12 of 36 positions shown, 18 images · IV contrast (OMNI 300/WATER & [ID] ISOVUE 300)
Comparison: None.

CLINICAL DATA: Initial encounter for 11 pound weight loss.

EXAM:
CT ABDOMEN AND PELVIS WITH CONTRAST
TECHNIQUE: Multidetector CT imaging of the abdomen and pelvis was performed
using the standard protocol following bolus administration of
intravenous contrast.
CONTRAST:  30mL OMNIPAQUE IOHEXOL 300 MG/ML SOLN, 100mL 7JPJMK-IEE
IOPAMIDOL (7JPJMK-IEE) INJECTION 61%

[Series 3: abd/pelvis with · axial · 0.70mm/px · z∈[-312,-2]mm · 7 of 84 slices shown, 12 images]
[im 11/84  soft-tissue]
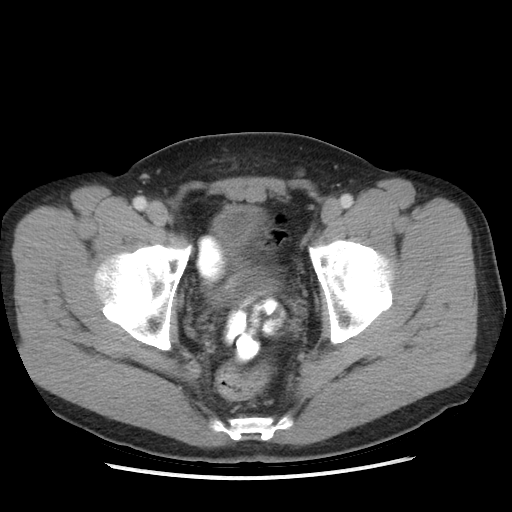
[im 11/84  bone]
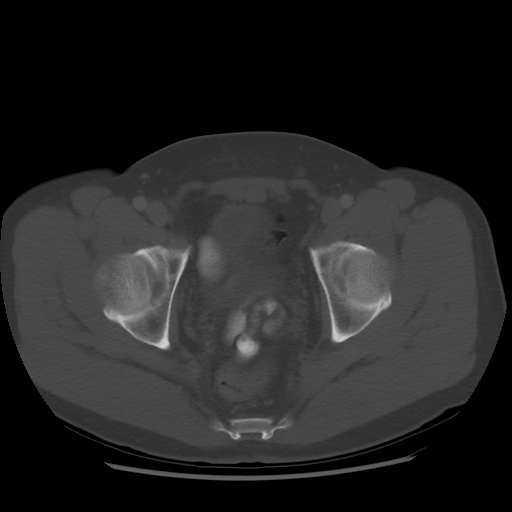
[im 21/84  soft-tissue]
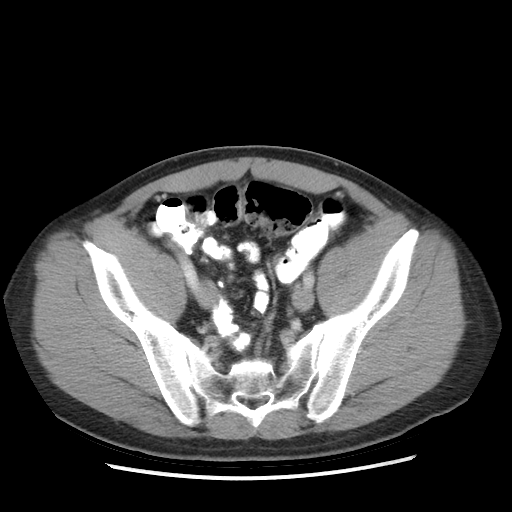
[im 32/84  soft-tissue]
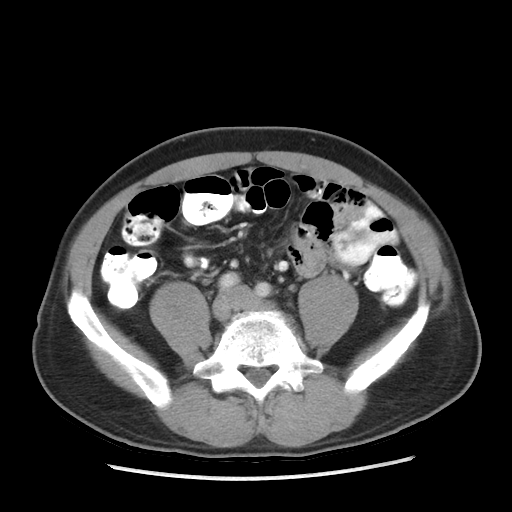
[im 42/84  soft-tissue]
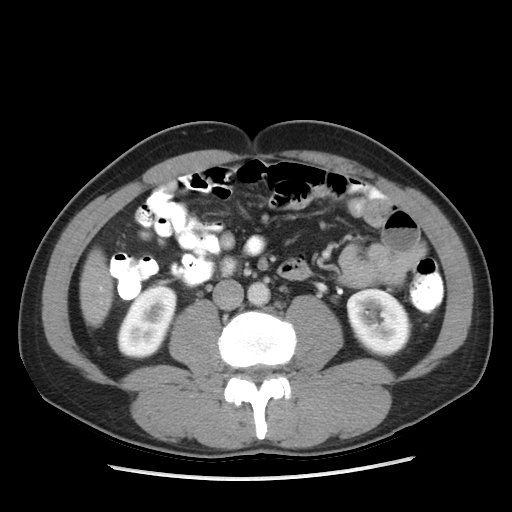
[im 42/84  lung]
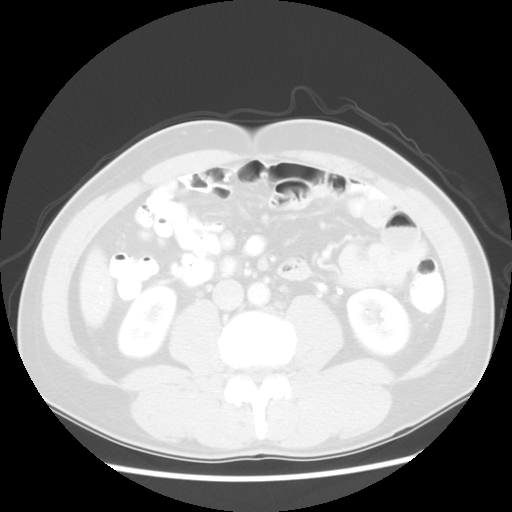
[im 52/84  soft-tissue]
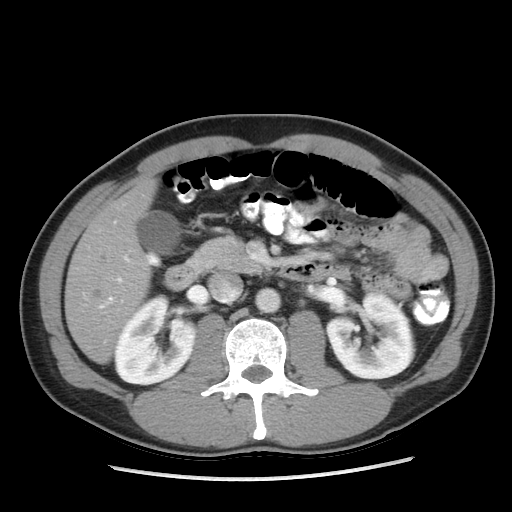
[im 52/84  lung]
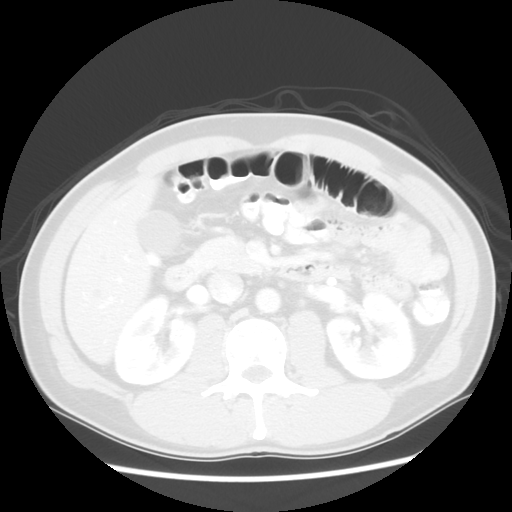
[im 63/84  soft-tissue]
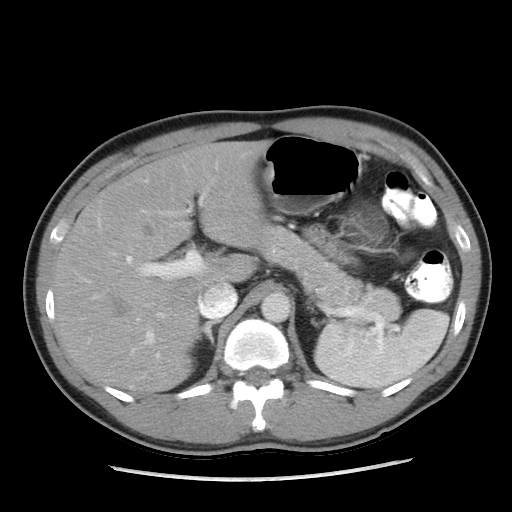
[im 63/84  lung]
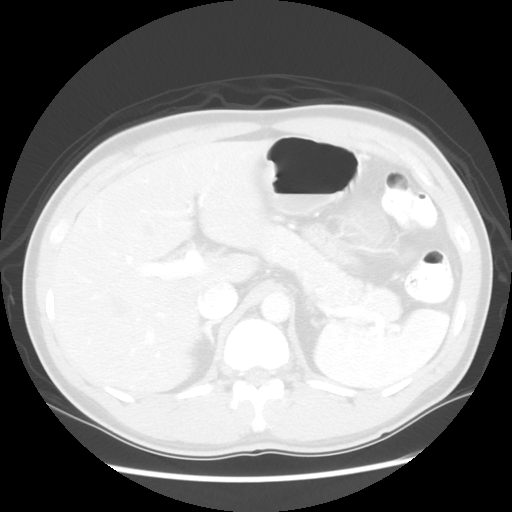
[im 73/84  soft-tissue]
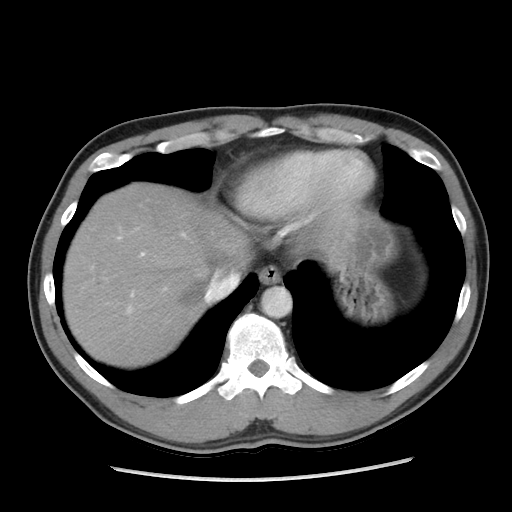
[im 73/84  lung]
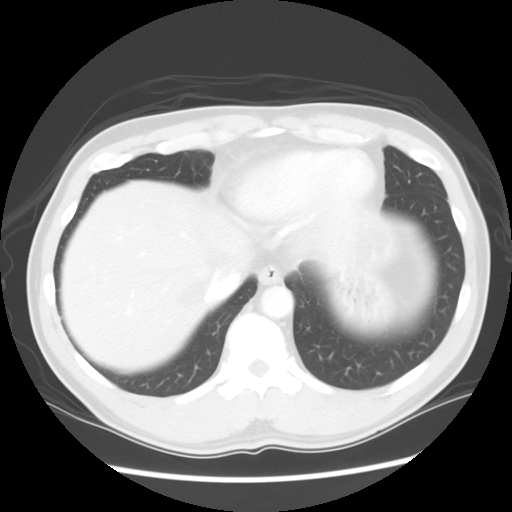

[Series 601: coronal body · coronal · 0.93mm/px · 1 of 110 slices shown, 2 images]
[im 37/110  soft-tissue]
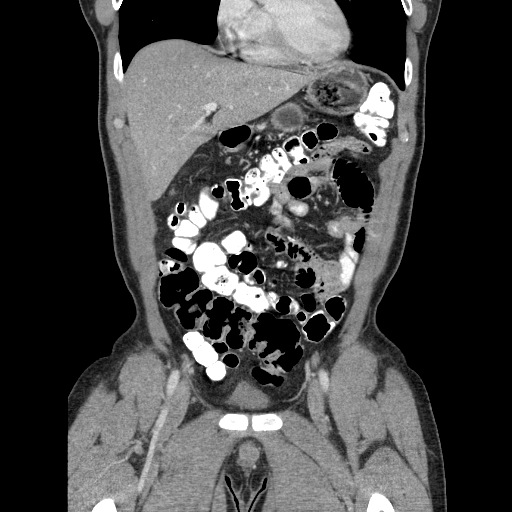
[im 37/110  bone]
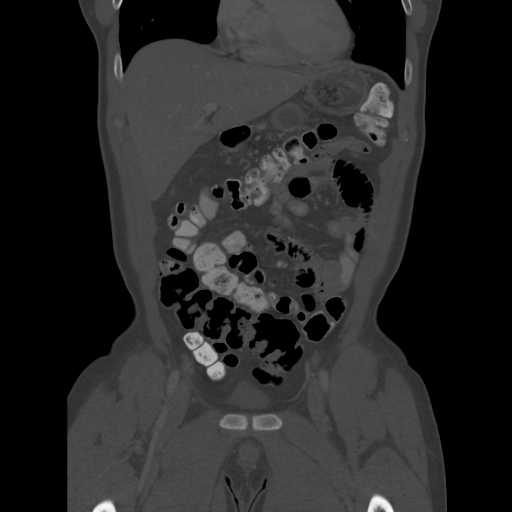

[Series 602: sagittal body · sagittal · 0.93mm/px · 4 of 144 slices shown]
[im 9/144  soft-tissue]
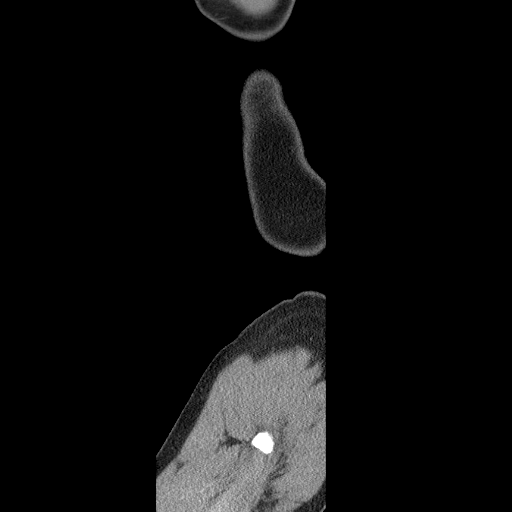
[im 27/144  soft-tissue]
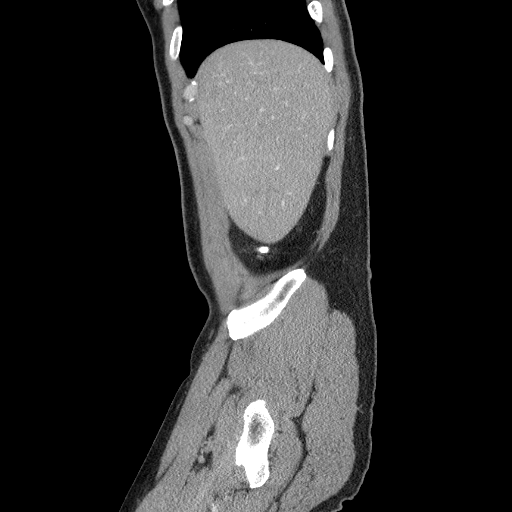
[im 45/144  soft-tissue]
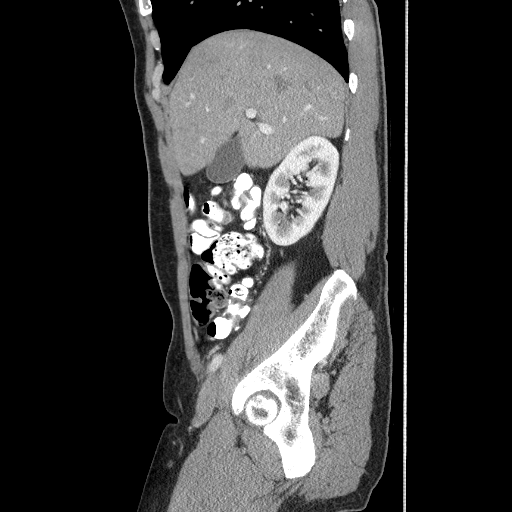
[im 63/144  soft-tissue]
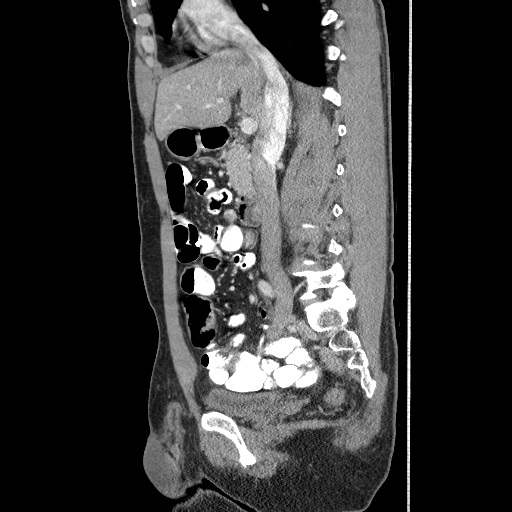

[12 of 36 positions shown; findings below may reference images not displayed]

FINDINGS: Lower chest:  Unremarkable.

Hepatobiliary: No focal abnormality within the liver parenchyma.
There is no evidence for gallstones, gallbladder wall thickening, or
pericholecystic fluid. No intrahepatic or extrahepatic biliary
dilation.

Pancreas: No focal mass lesion. No dilatation of the main duct. No
intraparenchymal cyst. No peripancreatic edema.

Spleen: No splenomegaly. No focal mass lesion.

Adrenals/Urinary Tract: No adrenal nodule or mass. Kidneys have
normal CT imaging features. No evidence for hydroureter. The urinary
bladder appears normal for the degree of distention.

Stomach/Bowel: Stomach is nondistended. No gastric wall thickening.
No evidence of outlet obstruction. There is some focal
circumferential wall thickening in the distal stomach on the portal
venous phase study which resolves on the renal delay imaging,
consistent with peristalsis. Duodenum is normally positioned as is
the ligament of Treitz. No small bowel wall thickening. No small
bowel dilatation. The terminal ileum is normal. The appendix is
normal. No gross colonic mass. No colonic wall thickening. No
substantial diverticular change.

Vascular/Lymphatic: No abdominal aortic aneurysm. There is no
gastrohepatic or hepatoduodenal ligament lymphadenopathy. No
intraperitoneal or retroperitoneal lymphadenopy. No pelvic sidewall
lymphadenopathy.

Reproductive: The prostate gland and seminal vesicles have normal
imaging features.

Other: No intraperitoneal free fluid.

Musculoskeletal: Bone windows reveal no worrisome lytic or sclerotic
osseous lesions.
IMPRESSION: Unremarkable CT evaluation of the abdomen and pelvis. Specifically,
no findings to explain the patient's history of weight loss.
